# Patient Record
Sex: Male | Born: 1950
Health system: Southern US, Community
[De-identification: ages and names within clinical notes are randomized; demographics above are authoritative.]

## PROBLEM LIST (undated history)

## (undated) DIAGNOSIS — I10 Essential (primary) hypertension: Secondary | ICD-10-CM

## (undated) DIAGNOSIS — N4340 Spermatocele of epididymis, unspecified: Secondary | ICD-10-CM

## (undated) HISTORY — DX: Essential (primary) hypertension: I10

## (undated) HISTORY — PX: TONSILLECTOMY: SUR1361

## (undated) HISTORY — PX: HERNIA REPAIR: SHX51

---

## 2005-12-05 ENCOUNTER — Ambulatory Visit: Payer: Self-pay | Admitting: Unknown Physician Specialty

## 2006-01-17 DIAGNOSIS — Z860100 Personal history of colon polyps, unspecified: Secondary | ICD-10-CM | POA: Insufficient documentation

## 2007-07-08 DIAGNOSIS — Z87891 Personal history of nicotine dependence: Secondary | ICD-10-CM | POA: Insufficient documentation

## 2009-12-05 DIAGNOSIS — Z8249 Family history of ischemic heart disease and other diseases of the circulatory system: Secondary | ICD-10-CM | POA: Insufficient documentation

## 2013-06-24 HISTORY — PX: COLONOSCOPY W/ POLYPECTOMY: SHX1380

## 2013-06-24 LAB — HM COLONOSCOPY

## 2014-01-26 ENCOUNTER — Emergency Department: Payer: Self-pay | Admitting: Emergency Medicine

## 2014-01-26 LAB — BASIC METABOLIC PANEL
Anion Gap: 8 (ref 7–16)
BUN: 21 mg/dL — ABNORMAL HIGH (ref 7–18)
CHLORIDE: 107 mmol/L (ref 98–107)
Calcium, Total: 8.5 mg/dL (ref 8.5–10.1)
Co2: 26 mmol/L (ref 21–32)
Creatinine: 0.9 mg/dL (ref 0.60–1.30)
Glucose: 98 mg/dL (ref 65–99)
OSMOLALITY: 284 (ref 275–301)
Potassium: 3.7 mmol/L (ref 3.5–5.1)
Sodium: 141 mmol/L (ref 136–145)

## 2014-01-26 LAB — CBC WITH DIFFERENTIAL/PLATELET
BASOS ABS: 0 10*3/uL (ref 0.0–0.1)
BASOS PCT: 0.2 %
EOS ABS: 0.2 10*3/uL (ref 0.0–0.7)
EOS PCT: 3 %
HCT: 40.3 % (ref 40.0–52.0)
HGB: 13.7 g/dL (ref 13.0–18.0)
LYMPHS PCT: 37.6 %
Lymphocyte #: 3.1 10*3/uL (ref 1.0–3.6)
MCH: 31.8 pg (ref 26.0–34.0)
MCHC: 33.9 g/dL (ref 32.0–36.0)
MCV: 94 fL (ref 80–100)
Monocyte #: 0.5 x10 3/mm (ref 0.2–1.0)
Monocyte %: 6.7 %
NEUTROS PCT: 52.5 %
Neutrophil #: 4.3 10*3/uL (ref 1.4–6.5)
PLATELETS: 221 10*3/uL (ref 150–440)
RBC: 4.3 10*6/uL — AB (ref 4.40–5.90)
RDW: 13.3 % (ref 11.5–14.5)
WBC: 8.2 10*3/uL (ref 3.8–10.6)

## 2014-01-26 LAB — TROPONIN I

## 2014-01-28 ENCOUNTER — Ambulatory Visit: Payer: Self-pay | Admitting: Family Medicine

## 2014-04-06 LAB — LIPID PANEL
CHOLESTEROL: 190 mg/dL (ref 0–200)
HDL: 48 mg/dL (ref 35–70)
LDL CALC: 125 mg/dL
TRIGLYCERIDES: 83 mg/dL (ref 40–160)

## 2014-04-06 LAB — BASIC METABOLIC PANEL
BUN: 19 mg/dL (ref 4–21)
Creatinine: 1 mg/dL (ref 0.6–1.3)
Glucose: 99 mg/dL
Potassium: 5.1 mmol/L (ref 3.4–5.3)
Sodium: 138 mmol/L (ref 137–147)

## 2014-04-06 LAB — PSA: PSA: 0.8

## 2015-04-09 IMAGING — CR DG CHEST 2V
1 series · 2 of 2 positions shown · non-contrast
Comparison: None.

CLINICAL DATA: Left-sided chest pain, congestion

EXAM:
CHEST  2 VIEW

[Series 1: w chest pa · 0.14mm/px · 2 of 2 slices shown]
[im 1/2]
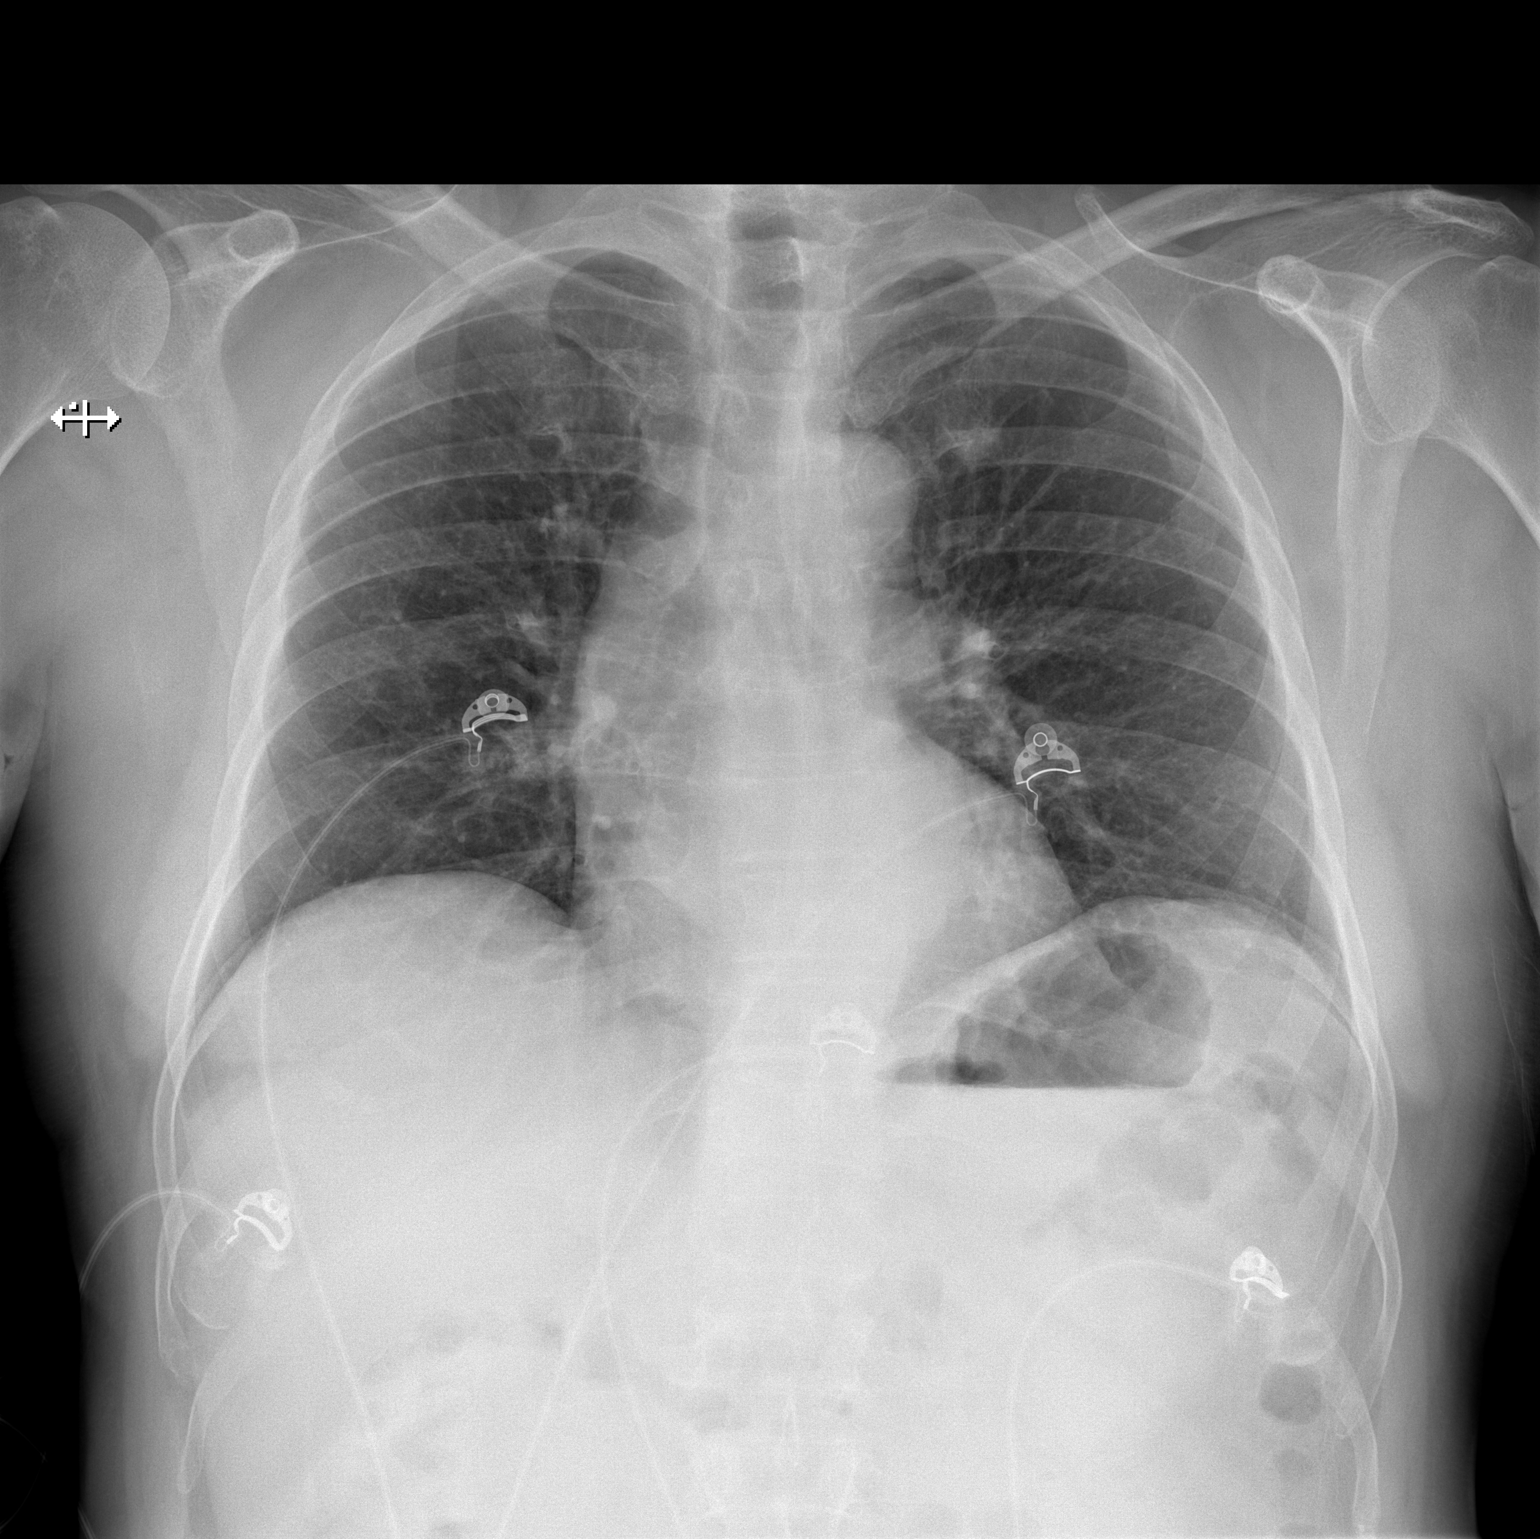
[im 2/2]
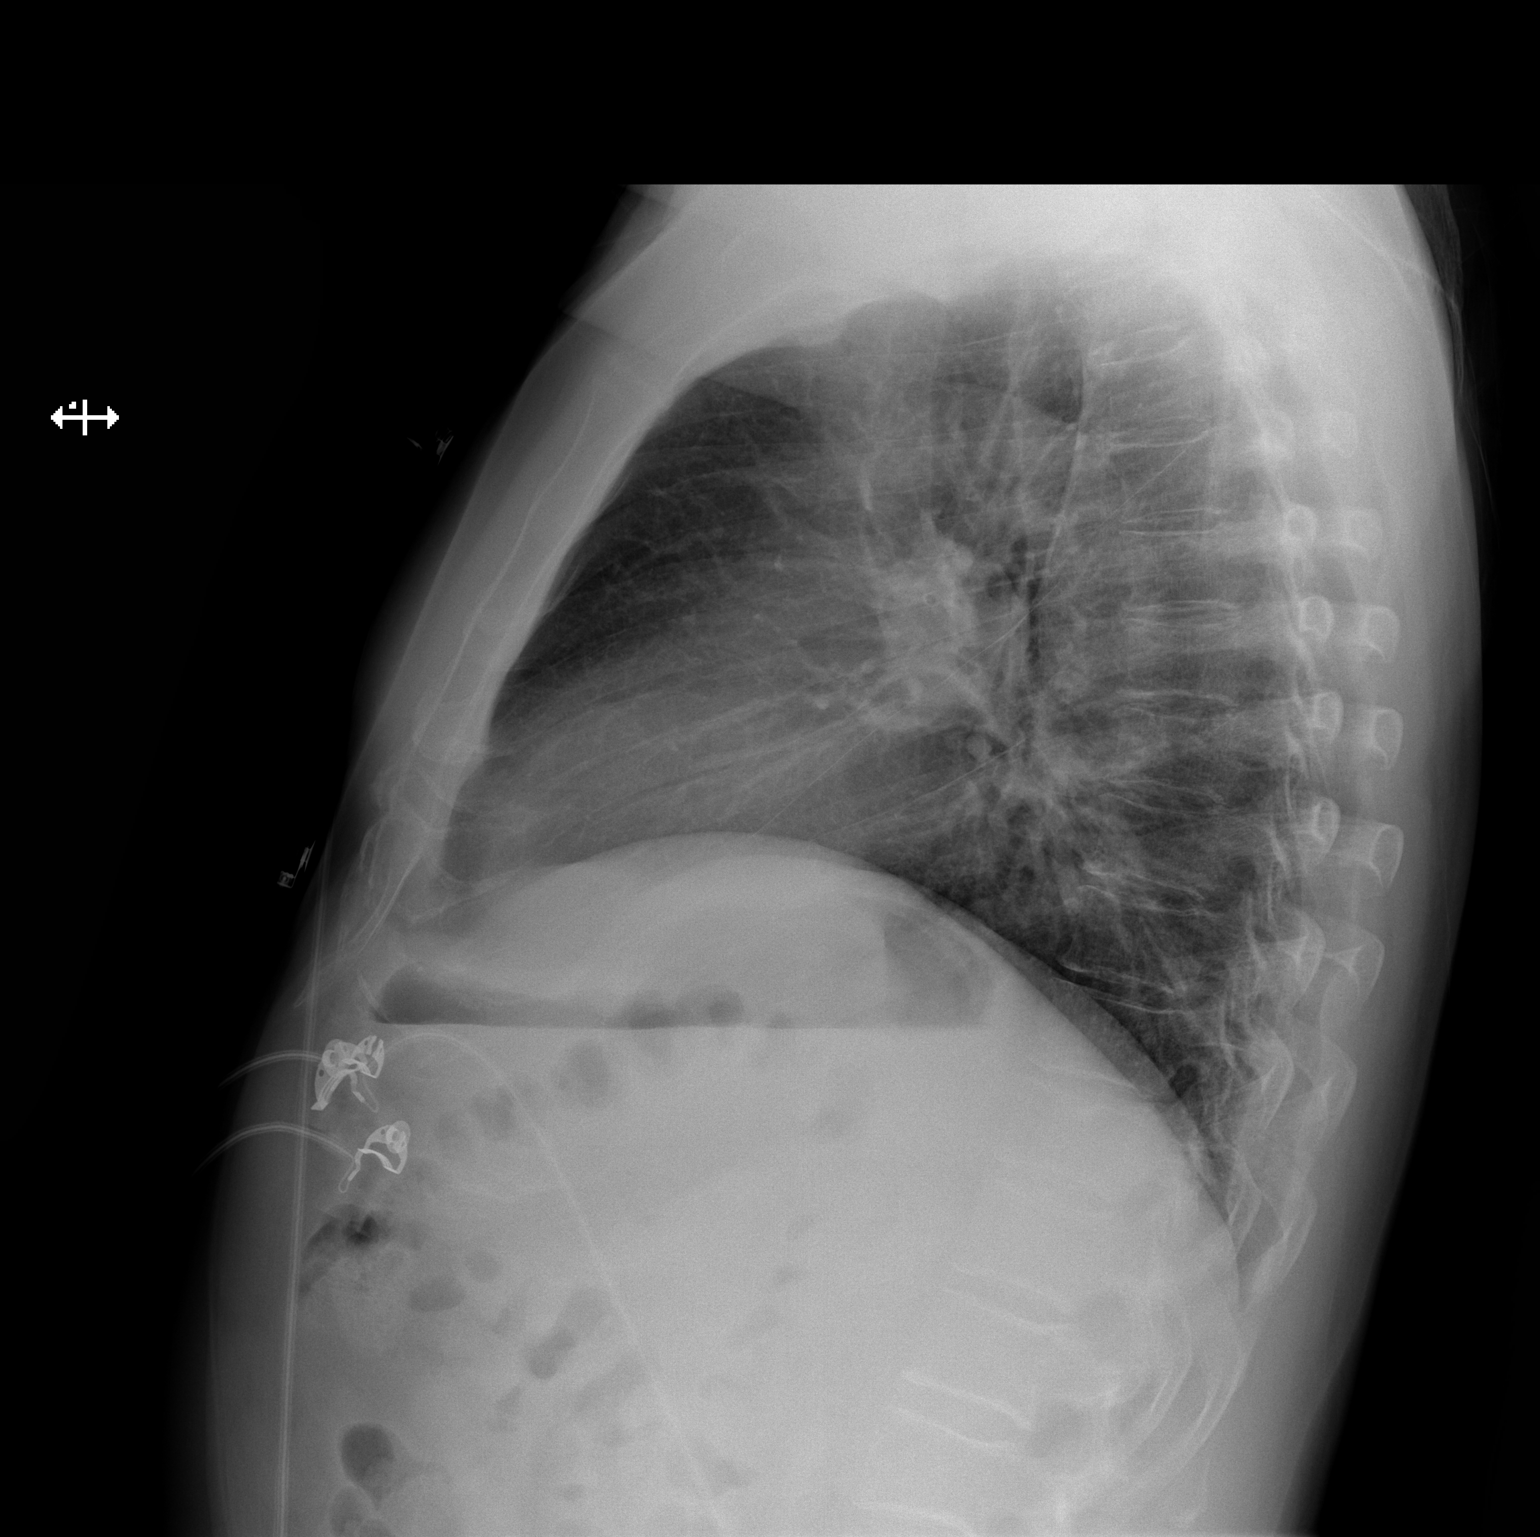

[2 of 2 positions shown; findings below may reference images not displayed]

FINDINGS: The cardiac and mediastinal silhouettes are within normal limits.

The lungs are normally inflated. No airspace consolidation, pleural
effusion, or pulmonary edema is identified. There is no
pneumothorax.

No acute osseous abnormality identified.
IMPRESSION: No active cardiopulmonary disease.

## 2015-04-13 DIAGNOSIS — N138 Other obstructive and reflux uropathy: Secondary | ICD-10-CM | POA: Insufficient documentation

## 2015-04-13 DIAGNOSIS — N401 Enlarged prostate with lower urinary tract symptoms: Secondary | ICD-10-CM | POA: Insufficient documentation

## 2015-04-13 DIAGNOSIS — N4340 Spermatocele of epididymis, unspecified: Secondary | ICD-10-CM | POA: Insufficient documentation

## 2015-04-14 ENCOUNTER — Encounter: Payer: Self-pay | Admitting: Family Medicine

## 2015-04-14 ENCOUNTER — Ambulatory Visit (INDEPENDENT_AMBULATORY_CARE_PROVIDER_SITE_OTHER): Payer: BLUE CROSS/BLUE SHIELD | Admitting: Family Medicine

## 2015-04-14 VITALS — BP 114/84 | HR 62 | Temp 97.7°F | Resp 16 | Ht 68.75 in | Wt 191.0 lb

## 2015-04-14 DIAGNOSIS — Z0000 Encounter for general adult medical examination without abnormal findings: Secondary | ICD-10-CM

## 2015-04-14 DIAGNOSIS — N400 Enlarged prostate without lower urinary tract symptoms: Secondary | ICD-10-CM | POA: Diagnosis not present

## 2015-04-14 DIAGNOSIS — Z23 Encounter for immunization: Secondary | ICD-10-CM | POA: Diagnosis not present

## 2015-04-14 MED ORDER — TAMSULOSIN HCL 0.4 MG PO CAPS: 0.4000 mg | ORAL_CAPSULE | Freq: Every day | ORAL | Status: DC

## 2015-04-14 NOTE — Patient Instructions (Signed)
A prescription for Flomax has been sent to [facility] on [address] in [place]

## 2015-04-14 NOTE — Progress Notes (Signed)
Patient: Anthony Oravec., Male    DOB: 29-Oct-1950, 64 y.o.   MRN: [id number] Visit Date: 04/14/2015  Today's Provider: Lelon Huh, MD   Chief Complaint  Patient presents with  . Annual Exam  . Benign Prostatic Hypertrophy   Subjective:    Annual physical exam  Anthony Levi. is a 64 y.o. male who presents today for health maintenance and complete physical. He feels fairly well. He reports exercising never. He reports he is sleeping poorly.  -----------------------------------------------------------------  BPH Follow up: Last office visit was 1 year ago. Management during this visit includes advising patient to start OTC Saw Palmetto to help with emptying bladder overnight. Today patient comes in stating he never started taking the [facility] because he couldn't remember the name of it. Patient feels like the symptoms have worsened since his last office visit. Has to get up several times every night to void. No burning or pain with urination.    Review of Systems  Constitutional: Negative for fever, chills, appetite change and fatigue.  HENT: Negative for congestion, ear pain, hearing loss, nosebleeds and trouble swallowing.   Eyes: Negative for pain and visual disturbance.  Respiratory: Negative for cough, chest tightness and shortness of breath.   Cardiovascular: Negative for chest pain, palpitations and leg swelling.  Gastrointestinal: Positive for abdominal pain (sharp pain occurs intermittently left upper quadrant). Negative for nausea, vomiting, diarrhea, constipation and blood in stool.  Endocrine: Negative for polydipsia, polyphagia and polyuria.  Genitourinary: Positive for frequency. Negative for dysuria and flank pain.  Musculoskeletal: Negative for myalgias, back pain, joint swelling, arthralgias and neck stiffness.  Skin: Negative for color change, rash and wound.  Neurological: Negative for dizziness, tremors, seizures, speech difficulty,  weakness, light-headedness and headaches.  Psychiatric/Behavioral: Negative for behavioral problems, confusion, sleep disturbance, dysphoric mood and decreased concentration. The patient is not nervous/anxious.   All other systems reviewed and are negative.   Social History He   Social History   Social History  . Marital Status: Married    Spouse Name: N/A  . Number of Children: N/A  . Years of Education: N/A   Social History Main Topics  . Smoking status: Not on file  . Smokeless tobacco: Not on file  . Alcohol Use: Not on file  . Drug Use: Not on file  . Sexual Activity: Not on file   Other Topics Concern  . Not on file   Social History Narrative  . No narrative on file    Patient Active Problem List   Diagnosis Date Noted  . Benign fibroma of prostate 04/13/2015  . Personal history of other infectious and parasitic diseases 04/13/2015  . Primary malignant neoplasm ([place]) 04/13/2015  . Family history of cardiovascular disease 12/05/2009  . History of tobacco use 07/08/2007  . History of colon polyps 01/17/2006    Past Surgical History  Procedure Laterality Date  . Colonoscopy w/ polypectomy  06/24/13  . Hernia repair    . Tonsillectomy      Family History  Family Status  Relation Status Death Age  . Mother Deceased   . Father Deceased 38  . Brother Alive     heart trouble and possible cancer  . Sister Alive   . Brother Alive    His family history includes Aneurysm in his brother; COPD in his mother; Heart attack in his father.    Allergies no known allergies  Previous Medications   No medications on  file    Patient Care Team: Birdie Sons, MD as PCP - General (Family Medicine)     Objective:   Vitals: BP 114/84 mmHg  Pulse 62  Temp(Src) 97.7 F (36.5 C) (Oral)  Resp 16  Ht 5' 8.75" (1.746 m)  Wt 191 lb (86.637 kg)  BMI 28.42 kg/m2  SpO2 95%   Physical Exam   General Appearance:    Alert, cooperative, no distress, appears stated age    Head:    Normocephalic, without obvious abnormality, atraumatic  Eyes:    PERRL, conjunctiva/corneas clear, EOM's intact, fundi    benign, both eyes       Ears:    Normal TM's and external ear canals, both ears  Nose:   Nares normal, septum midline, mucosa normal, no drainage   or sinus tenderness  Throat:   Lips, mucosa, and tongue normal; teeth and gums normal  Neck:   Supple, symmetrical, trachea midline, no adenopathy;       thyroid:  No enlargement/tenderness/nodules; no carotid   bruit or JVD  Back:     Symmetric, no curvature, ROM normal, no CVA tenderness  Lungs:     Clear to auscultation bilaterally, respirations unlabored  Chest wall:    No tenderness or deformity  Heart:    Regular rate and rhythm, S1 and S2 normal, no murmur, rub   or gallop  Abdomen:     Soft, non-tender, bowel sounds active all four quadrants,    no masses, no organomegaly  Genitalia:    deferred  Rectal:    no stool in vault and the prostate is enlarged at the bilateral, with an approx volume of 30 gms, 2+ bulge  Extremities:   Extremities normal, atraumatic, no cyanosis or edema  Pulses:   2+ and symmetric all extremities  Skin:   Skin color, texture, turgor normal, no rashes or lesions  Lymph nodes:   Cervical, supraclavicular, and axillary nodes normal  Neurologic:   CNII-XII intact. Normal strength, sensation and reflexes      throughout    Depression Screen PHQ 2/9 Scores 04/14/2015  PHQ - 2 Score 0  PHQ- 9 Score 1      Assessment & Plan:     Routine Health Maintenance and Physical Exam  Exercise Activities and Dietary recommendations Goals    None      Immunization History  Administered Date(s) Administered  . Hepatitis A 04/09/2014  . Tdap 07/08/2007  . Zoster 04/04/2012    Health Maintenance  Topic Date Due  . HIV Screening  08/08/1966  . INFLUENZA VACCINE  01/17/2016  . TETANUS/TDAP  07/07/2017  . COLONOSCOPY  06/25/2023  . ZOSTAVAX  Completed  . Hepatitis C  Screening  Completed      Discussed health benefits of physical activity, and encouraged him to engage in regular exercise appropriate for his age and condition.    --------------------------------------------------------------------  1. Annual physical exam  - Lipid panel - PSA - Basic metabolic panel  2. BPH (benign prostatic hyperplasia) - Try Flomax 0.4  3. Need for influenza vaccination  - Flu Vaccine QUAD 36+ mos IM

## 2015-04-15 ENCOUNTER — Telehealth: Payer: Self-pay

## 2015-04-15 LAB — BASIC METABOLIC PANEL
BUN/Creatinine Ratio: 18 (ref 10–22)
BUN: 17 mg/dL (ref 8–27)
CO2: 25 mmol/L (ref 18–29)
CREATININE: 0.96 mg/dL (ref 0.76–1.27)
Calcium: 9.5 mg/dL (ref 8.6–10.2)
Chloride: 101 mmol/L (ref 97–106)
GFR calc Af Amer: 97 mL/min/{1.73_m2} (ref >59)
GFR calc non Af Amer: 84 mL/min/{1.73_m2} (ref >59)
GLUCOSE: 96 mg/dL (ref 65–99)
Potassium: 5.6 mmol/L — ABNORMAL HIGH (ref 3.5–5.2)
Sodium: 141 mmol/L (ref 136–144)

## 2015-04-15 LAB — LIPID PANEL
CHOLESTEROL TOTAL: 203 mg/dL — AB (ref 100–199)
Chol/HDL Ratio: 4.1 ratio units (ref 0.0–5.0)
HDL: 50 mg/dL (ref >39)
LDL Calculated: 126 mg/dL — ABNORMAL HIGH (ref 0–99)
Triglycerides: 137 mg/dL (ref 0–149)
VLDL Cholesterol Cal: 27 mg/dL (ref 5–40)

## 2015-04-15 LAB — PSA: PROSTATE SPECIFIC AG, SERUM: 0.9 ng/mL (ref 0.0–4.0)

## 2015-04-15 NOTE — Telephone Encounter (Signed)
Patient advised of results and verbally voiced understanding.  

## 2015-04-15 NOTE — Telephone Encounter (Signed)
Pt is return call.  CB#[phone]/MW

## 2015-04-15 NOTE — Telephone Encounter (Signed)
-----   Message from Birdie Sons, MD sent at 04/15/2015  8:16 AM EDT ----- Cholesterol is a little high at 203. Should be below 200. Should reduce saturate fats such as red meat in diet. PSA. Sugar and kidney functions are normal. Check yearly.

## 2015-04-15 NOTE — Telephone Encounter (Signed)
Called patient back. No answer. Left message to call back.

## 2015-04-15 NOTE — Telephone Encounter (Signed)
Pt is returning call.  CB#[phone]/MW

## 2015-04-15 NOTE — Telephone Encounter (Signed)
Pt returned your call .  He said you could leave a detailed message .  Thanks Con Memos

## 2015-04-15 NOTE — Telephone Encounter (Signed)
[id number] 

## 2015-08-09 ENCOUNTER — Other Ambulatory Visit: Payer: Self-pay | Admitting: *Deleted

## 2015-08-09 DIAGNOSIS — N400 Enlarged prostate without lower urinary tract symptoms: Secondary | ICD-10-CM

## 2015-08-09 MED ORDER — TAMSULOSIN HCL 0.4 MG PO CAPS: 0.4000 mg | ORAL_CAPSULE | Freq: Every day | ORAL | Status: DC

## 2015-08-09 NOTE — Telephone Encounter (Signed)
Requesting 90 day supply.

## 2016-02-08 ENCOUNTER — Encounter: Payer: Self-pay | Admitting: Family Medicine

## 2016-02-08 ENCOUNTER — Ambulatory Visit (INDEPENDENT_AMBULATORY_CARE_PROVIDER_SITE_OTHER): Payer: BLUE CROSS/BLUE SHIELD | Admitting: Family Medicine

## 2016-02-08 VITALS — BP 130/98 | HR 70 | Temp 97.4°F | Resp 16 | Wt 195.0 lb

## 2016-02-08 DIAGNOSIS — H6693 Otitis media, unspecified, bilateral: Secondary | ICD-10-CM | POA: Diagnosis not present

## 2016-02-08 MED ORDER — AMOXICILLIN 500 MG PO CAPS: 1000.0000 mg | ORAL_CAPSULE | Freq: Two times a day (BID) | ORAL | 0 refills | Status: AC

## 2016-02-08 NOTE — Progress Notes (Signed)
       Patient: Anthony Reynolds. Male    DOB: 03-11-1951   65 y.o.   MRN: [id number] Visit Date: 02/08/2016  Today's Provider: Lelon Huh, MD   Chief Complaint  Patient presents with  . Ear Pain   Subjective:    Both ears feel congested and there is occasional pain. Patient had sinus congestion and sore throat 2 weeks ago, that has subsided now. Some congestion still remains.    Otalgia   There is pain in both ears. This is a new problem. The current episode started 1 to 4 weeks ago (2 weeks). The problem occurs constantly. The problem has been unchanged. There has been no fever. The pain is at a severity of 2/10. The pain is mild. Associated symptoms include ear discharge, headaches and hearing loss. Pertinent negatives include no abdominal pain, coughing, diarrhea, neck pain, rash, rhinorrhea, sore throat or vomiting. He has tried nothing for the symptoms.   Right worse than left.    No Known Allergies Current Meds  Medication Sig  . [DISCONTINUED] tamsulosin (FLOMAX) 0.4 MG CAPS capsule Take 1 capsule (0.4 mg total) by mouth daily.    Review of Systems  Constitutional: Negative for appetite change, chills and fever.  HENT: Positive for congestion, ear discharge, ear pain and hearing loss. Negative for rhinorrhea and sore throat.   Respiratory: Negative for cough, chest tightness, shortness of breath and wheezing.   Cardiovascular: Negative for chest pain and palpitations.  Gastrointestinal: Negative for abdominal pain, diarrhea, nausea and vomiting.  Musculoskeletal: Negative for neck pain.  Skin: Negative for rash.  Neurological: Positive for headaches.    Social History  Substance Use Topics  . Smoking status: Former Research scientist (life sciences)  . Smokeless tobacco: Not on file  . Alcohol use No   Objective:   BP (!) 130/98 (BP Location: Right Arm, Patient Position: Sitting, Cuff Size: Large)   Pulse 70   Temp 97.4 F (36.3 C) (Oral)   Resp 16   Wt 195 lb (88.5 kg)   SpO2  95%   BMI 29.01 kg/m   Physical Exam  General Appearance:    Alert, cooperative, no distress  HENT:   bilateral TM red, dull, bulging  Eyes:    PERRL, conjunctiva/corneas clear, EOM's intact       Lungs:     Clear to auscultation bilaterally, respirations unlabored  Heart:    Regular rate and rhythm  Neurologic:   Awake, alert, oriented x 3. No apparent focal neurological           defect.           Assessment & Plan:     1. Bilateral acute otitis media, recurrence not specified, unspecified otitis media type  - amoxicillin (AMOXIL) 500 MG capsule; Take 2 capsules (1,000 mg total) by mouth 2 (two) times daily.  Dispense: 40 capsule; Refill: 0  Call if symptoms change or if not rapidly improving.          Lelon Huh, MD  [facility]

## 2016-02-16 ENCOUNTER — Telehealth: Payer: Self-pay | Admitting: Family Medicine

## 2016-02-16 MED ORDER — AZITHROMYCIN 250 MG PO TABS: ORAL_TABLET | ORAL | 0 refills | Status: AC

## 2016-02-16 NOTE — Telephone Encounter (Signed)
Pt's wife called saying her husband seen Dr. Caryn Section last week for a sinus infection.  He was given a Rx for antibiotic for 10 days but his ears are stopped up.    He still has a couple days left of the antibiotic but not feeling a lot better  Please advise  Thanks Con Memos

## 2016-02-16 NOTE — Telephone Encounter (Signed)
Can try Zpack, have sent rx to pharmacy.

## 2016-02-17 NOTE — Telephone Encounter (Signed)
Advised patient as below.  

## 2016-02-23 ENCOUNTER — Telehealth: Payer: Self-pay | Admitting: Family Medicine

## 2016-02-23 NOTE — Telephone Encounter (Signed)
Patient advised. Patient scheduled a follow up appointment for 02/24/2016 at 1:45pm

## 2016-02-23 NOTE — Telephone Encounter (Signed)
Pt called saying he has taken 10 days of amoxil and then just finished his zpak.  He says his ears are still bothering him.  His right ear is especially stopped up.  His call back is [phone]  Thanks, Con Memos

## 2016-02-23 NOTE — Telephone Encounter (Signed)
Please advise 

## 2016-02-23 NOTE — Telephone Encounter (Signed)
They should be better by now. He should come in to recheck ears, make sure there is no wax, obstruction, or infection of the ear canals.

## 2016-02-24 ENCOUNTER — Encounter: Payer: Self-pay | Admitting: Family Medicine

## 2016-02-24 ENCOUNTER — Ambulatory Visit (INDEPENDENT_AMBULATORY_CARE_PROVIDER_SITE_OTHER): Payer: BLUE CROSS/BLUE SHIELD | Admitting: Family Medicine

## 2016-02-24 VITALS — BP 114/70 | HR 77 | Temp 98.0°F | Resp 16 | Wt 191.0 lb

## 2016-02-24 DIAGNOSIS — H9201 Otalgia, right ear: Secondary | ICD-10-CM

## 2016-02-24 MED ORDER — NEOMYCIN-POLYMYXIN-HC 3.5-10000-1 OT SOLN
4.0000 [drp] | Freq: Four times a day (QID) | OTIC | 0 refills | Status: AC
Start: 2016-02-24 — End: 2016-03-05

## 2016-02-24 NOTE — Progress Notes (Signed)
       Patient: Anthony Reynolds. Male    DOB: February 11, 1951   65 y.o.   MRN: [id number] Visit Date: 02/24/2016  Today's Provider: Lelon Huh, MD   Chief Complaint  Patient presents with  . Follow-up    ear pain   Subjective:    HPI  Follow up for ear pain  The patient was last seen for this 3 weeks ago. Changes made at last visit include starting Amoxicillin which he finished with improvement of left ear pain, but minimal improvement on right. Was followed by azithromycin also with no improvement. Also been feeling a little more fatigued and short of breath on exertion.   He reports excellent compliance with treatment. He feels that condition is Unchanged. Patient reports ears feel "stopped up" and is still having some sinus drainage, cough, and nasal congestion. Patient reports right ear is worse than left and worsened since last visit.   He is not having side effects.   ------------------------------------------------------------------------------------    No Known Allergies  No current outpatient prescriptions on file.  Review of Systems  Constitutional: Negative.   HENT: Positive for congestion and ear pain.   Respiratory: Positive for choking.     Social History  Substance Use Topics  . Smoking status: Former Research scientist (life sciences)  . Smokeless tobacco: Never Used  . Alcohol use No   Objective:   BP 114/70 (BP Location: Left Arm, Patient Position: Sitting, Cuff Size: Large)   Pulse 77   Temp 98 F (36.7 C) (Oral)   Resp 16   Wt 191 lb (86.6 kg)   SpO2 98%   BMI 28.41 kg/m   Physical Exam  General Appearance:    Alert, cooperative, no distress  HENT:   left TM normal without fluid or infection, Right distal ear canal inflamed with scabbed appearance on lower posterior TM neck without nodes, throat normal without erythema or exudate and sinuses nontender  Eyes:    PERRL, conjunctiva/corneas clear, EOM's intact       Lungs:     Clear to auscultation bilaterally,  respirations unlabored  Heart:    Regular rate and rhythm  Neurologic:   Awake, alert, oriented x 3. No apparent focal neurological           defect.           Assessment & Plan:     1. Right ear pain Irritated around TM, but appears to have scabbed lesion on TM or maybe a small piece of cerumen attached to it. Try cortisporin over the weekend, if not much better will have ENT evaluate next week.  - neomycin-polymyxin-hydrocortisone (CORTISPORIN) otic solution; Place 4 drops into the right ear 4 (four) times daily.  Dispense: 10 mL; Refill: 0       Lelon Huh, MD  [facility]

## 2016-04-11 ENCOUNTER — Telehealth: Payer: Self-pay | Admitting: Family Medicine

## 2016-04-11 DIAGNOSIS — Z0000 Encounter for general adult medical examination without abnormal findings: Secondary | ICD-10-CM

## 2016-04-11 NOTE — Telephone Encounter (Signed)
Please review. KW 

## 2016-04-11 NOTE — Telephone Encounter (Signed)
Pt is having an upcoming CPE and wants to go to the lab the morning of his PE and get his labs done.  Con Memos

## 2016-04-16 ENCOUNTER — Encounter: Payer: Self-pay | Admitting: Family Medicine

## 2016-04-16 ENCOUNTER — Ambulatory Visit (INDEPENDENT_AMBULATORY_CARE_PROVIDER_SITE_OTHER): Payer: BLUE CROSS/BLUE SHIELD | Admitting: Family Medicine

## 2016-04-16 VITALS — BP 130/88 | HR 51 | Temp 97.9°F | Resp 16 | Ht 69.0 in | Wt 193.0 lb

## 2016-04-16 DIAGNOSIS — Z0000 Encounter for general adult medical examination without abnormal findings: Secondary | ICD-10-CM | POA: Diagnosis not present

## 2016-04-16 DIAGNOSIS — Z23 Encounter for immunization: Secondary | ICD-10-CM

## 2016-04-16 NOTE — Patient Instructions (Addendum)
Recommend using OTC fluticasone (Flonase spray) for nasal and eustachian tube congestion

## 2016-04-16 NOTE — Progress Notes (Signed)
Patient: Anthony Flood., Male    DOB: [birth year], 65 y.o.   MRN: [id number] Visit Date: 04/16/2016  Today's Provider: Lelon Huh, MD   Chief Complaint  Patient presents with  . Annual Exam   Subjective:    Annual physical exam Anthony Soltero. is a 65 y.o. male who presents today for health maintenance and complete physical. He feels well. He reports exercising no. He reports he is sleeping well.  ----------------------------------------------------------------  Cholesterol Check: From 04/14/2015-checked labs which showed cholesterol a mildly elevated. Recommended reducing saturated fats such as red meats in diet.  Review of Systems  HENT: Positive for congestion and ear discharge.   Eyes: Negative.   Respiratory: Negative.   Cardiovascular: Negative.   Gastrointestinal: Negative.   Endocrine: Negative.   Genitourinary: Negative.   Musculoskeletal: Negative.   Allergic/Immunologic: Negative.   Neurological: Negative.   Hematological: Negative.   Psychiatric/Behavioral: Negative.   All other systems reviewed and are negative.   Social History      He  reports that he has quit smoking. He has never used smokeless tobacco. He reports that he does not drink alcohol or use drugs.       Social History   Social History  . Marital status: Married    Spouse name: N/A  . Number of children: 2  . Years of education: N/A   Occupational History  . Self Employed     Farming   Social History Main Topics  . Smoking status: Former Research scientist (life sciences)  . Smokeless tobacco: Never Used  . Alcohol use No  . Drug use: No  . Sexual activity: Not Asked   Other Topics Concern  . None   Social History Narrative  . None    History reviewed. No pertinent past medical history.   Patient Active Problem List   Diagnosis Date Noted  . BPH (benign prostatic hyperplasia) 04/13/2015  . Spermatocele 04/13/2015  . Family history of cardiovascular disease 12/05/2009  .  History of tobacco use 07/08/2007  . History of colon polyps 01/17/2006    Past Surgical History:  Procedure Laterality Date  . COLONOSCOPY W/ POLYPECTOMY  06/24/13  . HERNIA REPAIR    . TONSILLECTOMY      Family History        Family Status  Relation Status  . Mother Deceased  . Father Deceased at age 69  . Brother Alive   heart trouble and possible cancer  . Sister Alive  . Brother Alive        His family history includes Aneurysm in his brother; COPD in his mother; Heart attack in his father.    No Known Allergies  No outpatient prescriptions have been marked as taking for the 04/16/16 encounter (Office Visit) with Birdie Sons, MD.    Patient Care Team: Birdie Sons, MD as PCP - General (Family Medicine)     Objective:   Vitals: BP 130/88 (BP Location: Left Arm, Patient Position: Sitting, Cuff Size: Large)   Pulse (!) 51   Temp 97.9 F (36.6 C) (Oral)   Resp 16   Ht 5\' 9"  (1.753 m)   Wt 193 lb (87.5 kg)   SpO2 96%   BMI 28.50 kg/m    Physical Exam    General Appearance:    Alert, cooperative, no distress, appears stated age  Head:    Normocephalic, without obvious abnormality, atraumatic  Eyes:    PERRL, conjunctiva/corneas clear, EOM's intact,  fundi    benign, both eyes       Ears:    Normal TM's and external ear canals, both ears  Nose:   Nares normal, septum midline, mucosa normal, no drainage   or sinus tenderness  Throat:   Lips, mucosa, and tongue normal; teeth and gums normal  Neck:   Supple, symmetrical, trachea midline, no adenopathy;       thyroid:  No enlargement/tenderness/nodules; no carotid   bruit or JVD  Back:     Symmetric, no curvature, ROM normal, no CVA tenderness  Lungs:     Clear to auscultation bilaterally, respirations unlabored  Chest wall:    No tenderness or deformity  Heart:    Regular rate and rhythm, S1 and S2 normal, no murmur, rub   or gallop  Abdomen:     Soft, non-tender, bowel sounds active all four  quadrants,    no masses, no organomegaly  Genitalia:    deferred  Rectal:    deferred  Extremities:   Extremities normal, atraumatic, no cyanosis or edema  Pulses:   2+ and symmetric all extremities  Skin:   Skin color, texture, turgor normal, no rashes or lesions  Lymph nodes:   Cervical, supraclavicular, and axillary nodes normal  Neurologic:   CNII-XII intact. Normal strength, sensation and reflexes      throughout    Fall Risk  04/16/2016  Falls in the past year? No      Depression Screen PHQ 2/9 Scores 04/16/2016 04/14/2015  PHQ - 2 Score 0 0  PHQ- 9 Score 0 1      Assessment & Plan:     Routine Health Maintenance and Physical Exam  Exercise Activities and Dietary recommendations Goals    None      Immunization History  Administered Date(s) Administered  . Hepatitis A 04/09/2014  . Hepatitis A, Adult 04/14/2015  . Influenza,inj,Quad PF,36+ Mos 04/14/2015  . Tdap 07/08/2007  . Zoster 04/04/2012    Health Maintenance  Topic Date Due  . HIV Screening  08/08/1966  . INFLUENZA VACCINE  01/17/2016  . TETANUS/TDAP  07/07/2017  . COLONOSCOPY  06/25/2023  . ZOSTAVAX  Completed  . Hepatitis C Screening  Completed      Discussed health benefits of physical activity, and encouraged him to engage in regular exercise appropriate for his age and condition.    -------------------------------------------------------------------- 1. Annual physical exam   2. Need for influenza vaccination  - Flu Vaccine QUAD 36+ mos IM  He has been having some intermittent pressure in his ears, alternating between left and right. Recommend OTC fluticasone nasal spray. He can call back for ENT referral if this is not helpful.   Lelon Huh, MD  [facility]

## 2016-04-17 LAB — COMPREHENSIVE METABOLIC PANEL
ALBUMIN: 4.7 g/dL (ref 3.6–4.8)
ALT: 6 IU/L (ref 0–44)
AST: 15 IU/L (ref 0–40)
Albumin/Globulin Ratio: 2.2 (ref 1.2–2.2)
Alkaline Phosphatase: 59 IU/L (ref 39–117)
BUN / CREAT RATIO: 15 (ref 10–24)
BUN: 15 mg/dL (ref 8–27)
Bilirubin Total: 0.5 mg/dL (ref 0.0–1.2)
CALCIUM: 9.2 mg/dL (ref 8.6–10.2)
CO2: 26 mmol/L (ref 18–29)
CREATININE: 1 mg/dL (ref 0.76–1.27)
Chloride: 102 mmol/L (ref 96–106)
GFR, EST AFRICAN AMERICAN: 92 mL/min/{1.73_m2} (ref >59)
GFR, EST NON AFRICAN AMERICAN: 79 mL/min/{1.73_m2} (ref >59)
GLOBULIN, TOTAL: 2.1 g/dL (ref 1.5–4.5)
Glucose: 89 mg/dL (ref 65–99)
Potassium: 4.5 mmol/L (ref 3.5–5.2)
SODIUM: 141 mmol/L (ref 134–144)
TOTAL PROTEIN: 6.8 g/dL (ref 6.0–8.5)

## 2016-04-17 LAB — LIPID PANEL
CHOL/HDL RATIO: 4.2 ratio (ref 0.0–5.0)
Cholesterol, Total: 196 mg/dL (ref 100–199)
HDL: 47 mg/dL (ref >39)
LDL CALC: 127 mg/dL — AB (ref 0–99)
TRIGLYCERIDES: 109 mg/dL (ref 0–149)
VLDL Cholesterol Cal: 22 mg/dL (ref 5–40)

## 2016-04-17 LAB — PSA: Prostate Specific Ag, Serum: 1 ng/mL (ref 0.0–4.0)

## 2017-04-12 ENCOUNTER — Telehealth: Payer: Self-pay | Admitting: Family Medicine

## 2017-04-12 DIAGNOSIS — Z136 Encounter for screening for cardiovascular disorders: Secondary | ICD-10-CM

## 2017-04-12 DIAGNOSIS — Z131 Encounter for screening for diabetes mellitus: Secondary | ICD-10-CM

## 2017-04-12 DIAGNOSIS — Z125 Encounter for screening for malignant neoplasm of prostate: Secondary | ICD-10-CM

## 2017-04-12 NOTE — Telephone Encounter (Signed)
Please advise 

## 2017-04-12 NOTE — Telephone Encounter (Signed)
Pt wants to have his labs done before his appt on 04/17/17.  Please call when lab slip is ready

## 2017-04-12 NOTE — Telephone Encounter (Signed)
Patient is now on Medicare.

## 2017-04-12 NOTE — Telephone Encounter (Signed)
Labs we can order is determined by insurance. Is he still on BCBS or is he on Medicare now.

## 2017-04-17 ENCOUNTER — Ambulatory Visit (INDEPENDENT_AMBULATORY_CARE_PROVIDER_SITE_OTHER): Payer: Medicare Other | Admitting: Family Medicine

## 2017-04-17 ENCOUNTER — Encounter: Payer: Self-pay | Admitting: Family Medicine

## 2017-04-17 VITALS — BP 120/80 | HR 69 | Temp 97.8°F | Resp 16 | Ht 69.0 in | Wt 188.0 lb

## 2017-04-17 DIAGNOSIS — Z23 Encounter for immunization: Secondary | ICD-10-CM

## 2017-04-17 DIAGNOSIS — Z125 Encounter for screening for malignant neoplasm of prostate: Secondary | ICD-10-CM | POA: Diagnosis not present

## 2017-04-17 DIAGNOSIS — Z0000 Encounter for general adult medical examination without abnormal findings: Secondary | ICD-10-CM

## 2017-04-17 DIAGNOSIS — Z6827 Body mass index (BMI) 27.0-27.9, adult: Secondary | ICD-10-CM

## 2017-04-17 DIAGNOSIS — Z131 Encounter for screening for diabetes mellitus: Secondary | ICD-10-CM | POA: Diagnosis not present

## 2017-04-17 DIAGNOSIS — Z136 Encounter for screening for cardiovascular disorders: Secondary | ICD-10-CM | POA: Diagnosis not present

## 2017-04-17 LAB — LIPID PANEL
CHOL/HDL RATIO: 3.4 (calc) (ref <5.0)
CHOLESTEROL: 181 mg/dL (ref <200)
HDL: 54 mg/dL (ref >40)
LDL CHOLESTEROL (CALC): 113 mg/dL — AB
NON-HDL CHOLESTEROL (CALC): 127 mg/dL (ref <130)
TRIGLYCERIDES: 57 mg/dL (ref <150)

## 2017-04-17 LAB — PSA: PSA: 0.7 ng/mL (ref <=4.0)

## 2017-04-17 LAB — GLUCOSE, RANDOM: Glucose, Bld: 94 mg/dL (ref 65–99)

## 2017-04-17 NOTE — Patient Instructions (Addendum)
   Start taking vitamin B-12 1,000 mg a day   Contact [facility] to schedule follow up colonoscopy

## 2017-04-17 NOTE — Progress Notes (Signed)
Patient: Anthony Reynolds, Male    DOB: [birth year], 66 y.o.   MRN: [id number] Visit Date: 04/17/2017  Today's Provider: Lelon Huh, MD   Chief Complaint  Patient presents with  . Medicare Wellness   Subjective:   Initial preventative physical exam Anthony Reynolds is a 66 y.o. male who presents today for his Initial Preventative Physical Exam. He feels fairly well. He reports exercising work. He reports he is sleeping well.    Review of Systems  Constitutional: Negative.   HENT: Positive for ear pain and sinus pain.   Eyes: Negative.   Respiratory: Negative.   Cardiovascular: Negative.   Gastrointestinal: Negative.   Endocrine: Negative.   Genitourinary: Positive for scrotal swelling. Testicular pain: enlarged   Is scheduled to see Dr. Bernardo Heater on 05/02/2017  Social History   Social History  . Marital status: Married    Spouse name: N/A  . Number of children: 2  . Years of education: N/A   Occupational History  . Self Employed     Farming   Social History Main Topics  . Smoking status: Former Smoker    Quit date: 06/18/1973  . Smokeless tobacco: Never Used  . Alcohol use No  . Drug use: No  . Sexual activity: Not on file   Other Topics Concern  . Not on file   Social History Narrative  . No narrative on file    History reviewed. No pertinent past medical history.   Patient Active Problem List   Diagnosis Date Noted  . BPH (benign prostatic hyperplasia) 04/13/2015  . Spermatocele 04/13/2015  . Family history of cardiovascular disease 12/05/2009  . History of tobacco use 07/08/2007  . History of colon polyps 01/17/2006    Past Surgical History:  Procedure Laterality Date  . COLONOSCOPY W/ POLYPECTOMY  06/24/13  . HERNIA REPAIR    . TONSILLECTOMY      His family history includes Aneurysm in his brother; COPD in his mother; Heart attack in his father.     No current outpatient prescriptions on file.   Patient Care Team: Birdie Sons,  MD as PCP - General (Family Medicine)      Objective:   Vitals: BP 120/80 (BP Location: Right Arm, Patient Position: Sitting, Cuff Size: Large)   Pulse 69   Temp 97.8 F (36.6 C) (Oral)   Resp 16   Ht 5\' 9"  (1.753 m)   Wt 188 lb (85.3 kg)   SpO2 95%   BMI 27.76 kg/m   Physical Exam   General Appearance:    Alert, cooperative, no distress, appears stated age, overweight  Head:    Normocephalic, without obvious abnormality, atraumatic  Eyes:    PERRL, conjunctiva/corneas clear, EOM's intact, fundi    benign, both eyes       Ears:    Normal TM's and external ear canals, both ears  Nose:   Nares normal, septum midline, mucosa normal, no drainage   or sinus tenderness  Throat:   Lips, mucosa, and tongue normal; teeth and gums normal  Neck:   Supple, symmetrical, trachea midline, no adenopathy;       thyroid:  No enlargement/tenderness/nodules; no carotid   bruit or JVD  Back:     Symmetric, no curvature, ROM normal, no CVA tenderness  Lungs:     Clear to auscultation bilaterally, respirations unlabored  Chest wall:    No tenderness or deformity  Heart:    Regular rate and rhythm,  S1 and S2 normal, no murmur, rub   or gallop  Abdomen:     Soft, non-tender, bowel sounds active all four quadrants,    no masses, no organomegaly  Genitalia:    deferred  Rectal:    deferred  Extremities:   Extremities normal, atraumatic, no cyanosis or edema  Pulses:   2+ and symmetric all extremities  Skin:   Skin color, texture, turgor normal, no rashes or lesions  Lymph nodes:   Cervical, supraclavicular, and axillary nodes normal  Neurologic:   CNII-XII intact. Normal strength, sensation and reflexes      throughout    No exam data present  Activities of Daily Living In your present state of health, do you have any difficulty performing the following activities: 04/17/2017  Hearing? N  Vision? N  Difficulty concentrating or making decisions? N  Walking or climbing stairs? N  Dressing  or bathing? N  Doing errands, shopping? N  Some recent data might be hidden    Fall Risk Assessment Fall Risk  04/17/2017 04/16/2016  Falls in the past year? No No     Depression Screen PHQ 2/9 Scores 04/17/2017 04/16/2016 04/14/2015  PHQ - 2 Score 0 0 0  PHQ- 9 Score 0 0 1    Cognitive Testing - 6-CIT  Correct? Score   What year is it? yes 0 0 or 4  What month is it? yes 0 0 or 3  Memorize:    Pia Mau,  [birth year],  [place],      What time is it? (within 1 hour) yes 0 0 or 3  Count backwards from 20 yes 0 0, 2, or 4  Name the months of the year yes 0 0, 2, or 4  Repeat name & address above yes 3 0, 2, 4, 6, 8, or 10       TOTAL SCORE  3/28   Interpretation:  Normal  Normal (0-7) Abnormal (8-28)    Audit-C Alcohol Use Screening  Question Answer Points  How often do you have alcoholic drink? never 0  On days you do drink alcohol, how many drinks do you typically consume? 0 0  How oftey will you drink 6 or more in a total? never 0  Total Score:  0   A score of 3 or more in women, and 4 or more in men indicates increased risk for alcohol abuse, EXCEPT if all of the points are from question 1.    Assessment & Plan:     Initial Preventative Physical Exam  Reviewed patient's Family Medical History Reviewed and updated list of patient's medical providers Assessment of cognitive impairment was done Assessed patient's functional ability Established a written schedule for health screening [place] Completed and Reviewed  Exercise Activities and Dietary recommendations Goals    None      Immunization History  Administered Date(s) Administered  . Hepatitis A 04/09/2014  . Hepatitis A, Adult 04/14/2015  . Influenza,inj,Quad PF,6+ Mos 04/14/2015, 04/16/2016  . Tdap 07/08/2007  . Zoster 04/04/2012    Health Maintenance  Topic Date Due  . HIV Screening  08/08/1966  . COLONOSCOPY  06/24/2016  . PNA vac Low Risk Adult (1 of 2 -  PCV13) 08/08/2016  . INFLUENZA VACCINE  01/16/2017  . TETANUS/TDAP  07/07/2017  . Hepatitis C Screening  Completed     Discussed health benefits of physical activity, and encouraged him to engage in regular exercise appropriate for his age  and condition.    ------------------------------------------------------------------------------------------------------------  1. Welcome to Medicare preventive visit Advised he is due for follow up colonoscopy due to history of colon polyps and to contact [facility].  - EKG 12-Lead  2. Need for influenza vaccination  - Flu vaccine HIGH DOSE PF  3. Need for pneumococcal vaccination  - Pneumococcal conjugate vaccine 13-valent IM  4. BMI 27.0-27.9,adult Encouraged regular exercise and prudent diet. To get and keep weight below 175 pounds.    Lelon Huh, MD  [facility]

## 2017-05-02 ENCOUNTER — Other Ambulatory Visit: Payer: Self-pay | Admitting: Radiology

## 2017-05-02 ENCOUNTER — Ambulatory Visit (INDEPENDENT_AMBULATORY_CARE_PROVIDER_SITE_OTHER): Payer: Medicare Other | Admitting: Urology

## 2017-05-02 ENCOUNTER — Encounter: Payer: Self-pay | Admitting: Urology

## 2017-05-02 ENCOUNTER — Encounter: Payer: Self-pay | Admitting: Radiology

## 2017-05-02 VITALS — BP 137/84 | HR 59 | Ht 69.0 in | Wt 184.0 lb

## 2017-05-02 DIAGNOSIS — N50819 Testicular pain, unspecified: Secondary | ICD-10-CM | POA: Diagnosis not present

## 2017-05-02 DIAGNOSIS — N433 Hydrocele, unspecified: Secondary | ICD-10-CM | POA: Diagnosis not present

## 2017-05-02 LAB — URINALYSIS, COMPLETE
BILIRUBIN UA: NEGATIVE
GLUCOSE, UA: NEGATIVE
Ketones, UA: NEGATIVE
Leukocytes, UA: NEGATIVE
NITRITE UA: NEGATIVE
Protein, UA: NEGATIVE
RBC, UA: NEGATIVE
Specific Gravity, UA: 1.015 (ref 1.005–1.030)
UUROB: 0.2 mg/dL (ref 0.2–1.0)
pH, UA: 6.5 (ref 5.0–7.5)

## 2017-05-02 NOTE — Progress Notes (Signed)
05/02/2017 9:06 AM   [name] Jun 05, 1951 [id number]  Referring provider: Birdie Sons, MD [address] [place], [place] [address]  Chief Complaint  Patient presents with  . Groin Swelling    New Patient    HPI: Anthony Reynolds is a 66 year old male who presents for evaluation of right hemiscrotal swelling.  I saw him at [facility] in October 2014 for an asymptomatic large right spermatocele.  He desired observation.  He states over the last 2-3 months that the swelling has increased.  He has no significant discomfort however is bothered by the increase in size.  He has recently noted decreased semen volume and thought this may be due to the spermatocele.  He has mild erectile dysfunction.  PMH: History reviewed. No pertinent past medical history.  Surgical History: Past Surgical History:  Procedure Laterality Date  . COLONOSCOPY W/ POLYPECTOMY  06/24/13  . HERNIA REPAIR    . TONSILLECTOMY      Home Medications:  Allergies as of 05/02/2017   No Known Allergies     Medication List    as of 05/02/2017  9:06 AM   You have not been prescribed any medications.     Allergies: No Known Allergies  Family History: Family History  Problem Relation Age of Onset  . COPD Mother   . Heart attack Father   . Aneurysm Brother   . Prostate cancer Neg Hx   . Kidney cancer Neg Hx   . Bladder Cancer Neg Hx     Social History:  reports that he quit smoking about 43 years ago. he has never used smokeless tobacco. He reports that he does not drink alcohol or use drugs.  ROS: UROLOGY Frequent Urination?: No Hard to postpone urination?: No Burning/pain with urination?: No Get up at night to urinate?: Yes Leakage of urine?: No Urine stream starts and stops?: No Trouble starting stream?: No Do you have to strain to urinate?: No Blood in urine?: No Urinary tract infection?: No Sexually transmitted disease?: No Injury to kidneys or bladder?: No Painful  intercourse?: No Weak stream?: No Erection problems?: Yes Penile pain?: No  Gastrointestinal Nausea?: No Vomiting?: No Indigestion/heartburn?: Yes Diarrhea?: No Constipation?: No  Constitutional Fever: No Night sweats?: No Weight loss?: No Fatigue?: No  Skin Skin rash/lesions?: No Itching?: No  Eyes Blurred vision?: No Double vision?: No  Ears/Nose/Throat Sore throat?: No Sinus problems?: No  Hematologic/Lymphatic Swollen glands?: No Easy bruising?: No  Cardiovascular Leg swelling?: No Chest pain?: No  Respiratory Cough?: No Shortness of breath?: No  Endocrine Excessive thirst?: No  Musculoskeletal Back pain?: No Joint pain?: No  Neurological Headaches?: No Dizziness?: No  Psychologic Depression?: No Anxiety?: No  Physical Exam: BP 137/84   Pulse (!) 59   Ht 5\' 9"  (1.753 m)   Wt 184 lb (83.5 kg)   BMI 27.17 kg/m   Constitutional:  Alert and oriented, No acute distress. HEENT: [place] AT, moist mucus membranes.  Trachea midline, no masses. Cardiovascular: No clubbing, cyanosis, or edema. RRR Respiratory: Normal respiratory effort, no increased work of breathing. Lungs clear GI: Abdomen is soft, nontender, nondistended, no abdominal masses GU: No CVA tenderness.  Enlarged right hemiscrotal mass consistent with a hydrocele.  The right testis is not palpable.  No evidence of hernia. Skin: No rashes, bruises or suspicious lesions. Lymph: No cervical or inguinal adenopathy. Neurologic: Grossly intact, no focal deficits, moving all 4 extremities. Psychiatric: Normal mood and affect.  Laboratory Data: Lab Results  Component Value Date   WBC 8.2 01/26/2014   HGB 13.7 01/26/2014   HCT 40.3 01/26/2014   MCV 94 01/26/2014   PLT 221 01/26/2014    Lab Results  Component Value Date   CREATININE 1.00 04/16/2016    Lab Results  Component Value Date   PSA1 1.0 04/16/2016   PSA1 0.9 04/14/2015    Urinalysis Dipstick/microscopy  negative   Assessment & Plan:    1. Hydrocele, unspecified hydrocele type Prior exam in 2014 remarkable for a palpable right testis however the testis is not palpable on today's exam and findings are consistent with a right hydrocele.  He was informed that treatment would not impact his semen volume and unlikely to improve his erectile dysfunction.  We discussed management options including continued observation, aspiration/sclerotherapy and hydrocelectomy.  He would like to proceed with hydrocelectomy.  The procedure was discussed in detail including potential risks of bleeding, infection, recurrence, testicular atrophy as well as the risks of anesthesia.  He indicated all questions were answered to his satisfaction and desires to proceed.  A scrotal ultrasound was ordered.  - Urinalysis, Complete - US Scrotum; Future   Abbie Sons, [place] [address] [place], [place] [address] [phone]

## 2017-05-02 NOTE — H&P (View-Only) (Signed)
05/02/2017 9:06 AM   Anthony Reynolds 30-Jan-1951 [id number]  Referring provider: Birdie Sons, MD [address] [place], [place] [address]  Chief Complaint  Patient presents with  . Groin Swelling    New Patient    HPI: Anthony Reynolds is a 66 year old male who presents for evaluation of right hemiscrotal swelling.  I saw him at [facility] in October 2014 for an asymptomatic large right spermatocele.  He desired observation.  He states over the last 2-3 months that the swelling has increased.  He has no significant discomfort however is bothered by the increase in size.  He has recently noted decreased semen volume and thought this may be due to the spermatocele.  He has mild erectile dysfunction.  PMH: History reviewed. No pertinent past medical history.  Surgical History: Past Surgical History:  Procedure Laterality Date  . COLONOSCOPY W/ POLYPECTOMY  06/24/13  . HERNIA REPAIR    . TONSILLECTOMY      Home Medications:  Allergies as of 05/02/2017   No Known Allergies     Medication List    as of 05/02/2017  9:06 AM   You have not been prescribed any medications.     Allergies: No Known Allergies  Family History: Family History  Problem Relation Age of Onset  . COPD Mother   . Heart attack Father   . Aneurysm Brother   . Prostate cancer Neg Hx   . Kidney cancer Neg Hx   . Bladder Cancer Neg Hx     Social History:  reports that he quit smoking about 43 years ago. he has never used smokeless tobacco. He reports that he does not drink alcohol or use drugs.  ROS: UROLOGY Frequent Urination?: No Hard to postpone urination?: No Burning/pain with urination?: No Get up at night to urinate?: Yes Leakage of urine?: No Urine stream starts and stops?: No Trouble starting stream?: No Do you have to strain to urinate?: No Blood in urine?: No Urinary tract infection?: No Sexually transmitted disease?: No Injury to kidneys or bladder?: No Painful  intercourse?: No Weak stream?: No Erection problems?: Yes Penile pain?: No  Gastrointestinal Nausea?: No Vomiting?: No Indigestion/heartburn?: Yes Diarrhea?: No Constipation?: No  Constitutional Fever: No Night sweats?: No Weight loss?: No Fatigue?: No  Skin Skin rash/lesions?: No Itching?: No  Eyes Blurred vision?: No Double vision?: No  Ears/Nose/Throat Sore throat?: No Sinus problems?: No  Hematologic/Lymphatic Swollen glands?: No Easy bruising?: No  Cardiovascular Leg swelling?: No Chest pain?: No  Respiratory Cough?: No Shortness of breath?: No  Endocrine Excessive thirst?: No  Musculoskeletal Back pain?: No Joint pain?: No  Neurological Headaches?: No Dizziness?: No  Psychologic Depression?: No Anxiety?: No  Physical Exam: BP 137/84   Pulse (!) 59   Ht 5\' 9"  (1.753 m)   Wt 184 lb (83.5 kg)   BMI 27.17 kg/m   Constitutional:  Alert and oriented, No acute distress. HEENT: [place] AT, moist mucus membranes.  Trachea midline, no masses. Cardiovascular: No clubbing, cyanosis, or edema. RRR Respiratory: Normal respiratory effort, no increased work of breathing. Lungs clear GI: Abdomen is soft, nontender, nondistended, no abdominal masses GU: No CVA tenderness.  Enlarged right hemiscrotal mass consistent with a hydrocele.  The right testis is not palpable.  No evidence of hernia. Skin: No rashes, bruises or suspicious lesions. Lymph: No cervical or inguinal adenopathy. Neurologic: Grossly intact, no focal deficits, moving all 4 extremities. Psychiatric: Normal mood and affect.  Laboratory Data: Lab Results  Component Value Date   WBC 8.2 01/26/2014   HGB 13.7 01/26/2014   HCT 40.3 01/26/2014   MCV 94 01/26/2014   PLT 221 01/26/2014    Lab Results  Component Value Date   CREATININE 1.00 04/16/2016    Lab Results  Component Value Date   PSA1 1.0 04/16/2016   PSA1 0.9 04/14/2015    Urinalysis Dipstick/microscopy  negative   Assessment & Plan:    1. Hydrocele, unspecified hydrocele type Prior exam in 2014 remarkable for a palpable right testis however the testis is not palpable on today's exam and findings are consistent with a right hydrocele.  He was informed that treatment would not impact his semen volume and unlikely to improve his erectile dysfunction.  We discussed management options including continued observation, aspiration/sclerotherapy and hydrocelectomy.  He would like to proceed with hydrocelectomy.  The procedure was discussed in detail including potential risks of bleeding, infection, recurrence, testicular atrophy as well as the risks of anesthesia.  He indicated all questions were answered to his satisfaction and desires to proceed.  A scrotal ultrasound was ordered.  - Urinalysis, Complete - US Scrotum; Future   Abbie Sons, [place] [address] [place], [place] [address] [phone]

## 2017-05-14 ENCOUNTER — Ambulatory Visit
Admission: RE | Admit: 2017-05-14 | Discharge: 2017-05-14 | Disposition: A | Discharge status: Home / Self Care | Payer: Medicare Other | Source: Ambulatory Visit | Attending: Urology | Admitting: Urology

## 2017-05-14 DIAGNOSIS — N503 Cyst of epididymis: Secondary | ICD-10-CM | POA: Diagnosis not present

## 2017-05-14 DIAGNOSIS — N433 Hydrocele, unspecified: Secondary | ICD-10-CM | POA: Diagnosis not present

## 2017-05-16 ENCOUNTER — Other Ambulatory Visit: Payer: Self-pay | Admitting: Radiology

## 2017-05-17 ENCOUNTER — Telehealth: Payer: Self-pay

## 2017-05-17 ENCOUNTER — Other Ambulatory Visit: Payer: Self-pay

## 2017-05-17 ENCOUNTER — Encounter
Admission: RE | Admit: 2017-05-17 | Discharge: 2017-05-17 | Disposition: A | Discharge status: Home / Self Care | Payer: Medicare Other | Source: Ambulatory Visit | Attending: Urology | Admitting: Urology

## 2017-05-17 HISTORY — DX: Spermatocele of epididymis, unspecified: N43.40

## 2017-05-17 NOTE — Telephone Encounter (Signed)
-----   Message from Abbie Sons, MD sent at 05/16/2017 10:09 AM EST ----- Scrotal ultrasound shows a large spermatocele.  Proceed with surgery as scheduled

## 2017-05-17 NOTE — Telephone Encounter (Signed)
Will send a letter

## 2017-05-17 NOTE — Patient Instructions (Signed)
Your procedure is scheduled on:  Friday, May 24, 2017 Report to Same Day Surgery on the 2nd floor in the [facility]. To find out your arrival time, please call [phone] between 1PM - 3PM on: Thursday, May 23, 2017  REMEMBER: Instructions that are not followed completely may result in serious medical risk, up to and including death; or upon the discretion of your surgeon and anesthesiologist your surgery may need to be rescheduled.  Do not eat food after midnight the night before your procedure.  No gum chewing or hard candies.  You may however, drink CLEAR liquids up to 2 hours before you are scheduled to arrive at the hospital for your procedure.  Do not drink clear liquids within 2 hours of the start of your surgery.  Clear liquids include: - water  - apple juice without pulp - clear gatorade - black coffee or tea (Do NOT add anything to the coffee or tea) Do NOT drink anything that is not on this list.  No Alcohol for 24 hours before or after surgery.  No Smoking including e-cigarettes for 24 hours prior to surgery. No chewable tobacco products for at least 6 hours prior to surgery. No nicotine patches on the day of surgery.  Notify your doctor if there is any change in your medical condition (cold, fever, infection).  Do not wear jewelry, make-up, hairpins, clips or nail polish.  Do not wear lotions, powders, or perfumes. You may wear deodorant.  Do not shave 48 hours prior to surgery. Men may shave face and neck.  Contacts and dentures may not be worn into surgery.  Do not bring valuables to the hospital. [facility] is not responsible for any belongings or valuables.   TAKE THESE MEDICATIONS THE MORNING OF SURGERY WITH A SIP OF WATER:  NONE  NOW!!  Stop ASPIRIN AND Anti-inflammatories such as Advil, Aleve, Ibuprofen, Motrin, Naproxen, Naprosyn, Goodie powder, or aspirin products. (May take Tylenol or Acetaminophen if needed.)  Stop ANY OVER THE COUNTER  supplements until after surgery.   If you are being discharged the day of surgery, you will not be allowed to drive home. You will need someone to drive you home and stay with you that night.   If you are taking public transportation, you will need to have a responsible adult to with you.  Please call the number above if you have any questions about these instructions.

## 2017-05-23 MED ORDER — CEFAZOLIN SODIUM-DEXTROSE 2-4 GM/100ML-% IV SOLN
2.0000 g | INTRAVENOUS | Status: AC
  Administered 2017-05-24: 2 g via INTRAVENOUS

## 2017-05-24 ENCOUNTER — Ambulatory Visit: Payer: Medicare Other | Admitting: Registered Nurse

## 2017-05-24 ENCOUNTER — Encounter
Admission: RE | Disposition: A | Discharge status: Home / Self Care | Payer: Self-pay | Source: Ambulatory Visit | Attending: Urology

## 2017-05-24 ENCOUNTER — Ambulatory Visit
Admission: RE | Admit: 2017-05-24 | Discharge: 2017-05-24 | Disposition: A | Discharge status: Home / Self Care | Payer: Medicare Other | Source: Ambulatory Visit | Attending: Urology | Admitting: Urology

## 2017-05-24 DIAGNOSIS — N433 Hydrocele, unspecified: Secondary | ICD-10-CM

## 2017-05-24 DIAGNOSIS — N432 Other hydrocele: Secondary | ICD-10-CM | POA: Diagnosis not present

## 2017-05-24 DIAGNOSIS — Z87891 Personal history of nicotine dependence: Secondary | ICD-10-CM | POA: Insufficient documentation

## 2017-05-24 DIAGNOSIS — N4341 Spermatocele of epididymis, single: Secondary | ICD-10-CM | POA: Insufficient documentation

## 2017-05-24 DIAGNOSIS — N4340 Spermatocele of epididymis, unspecified: Secondary | ICD-10-CM

## 2017-05-24 DIAGNOSIS — N529 Male erectile dysfunction, unspecified: Secondary | ICD-10-CM | POA: Insufficient documentation

## 2017-05-24 HISTORY — PX: SPERMATOCELECTOMY: SHX2420

## 2017-05-24 SURGERY — EXCISION, SPERMATOCELE
Anesthesia: General | Site: Scrotum | Laterality: Right | Wound class: Clean

## 2017-05-24 MED ORDER — ONDANSETRON HCL 4 MG/2ML IJ SOLN
INTRAMUSCULAR | Status: AC
  Filled 2017-05-24: qty 2

## 2017-05-24 MED ORDER — LACTATED RINGERS IV SOLN
INTRAVENOUS | Status: DC
  Administered 2017-05-24: 09:00:00 via INTRAVENOUS

## 2017-05-24 MED ORDER — LIDOCAINE HCL (PF) 2 % IJ SOLN
INTRAMUSCULAR | Status: AC
  Filled 2017-05-24: qty 10

## 2017-05-24 MED ORDER — SULFAMETHOXAZOLE-TRIMETHOPRIM 800-160 MG PO TABS: 1.0000 | ORAL_TABLET | Freq: Two times a day (BID) | ORAL | 0 refills | Status: DC

## 2017-05-24 MED ORDER — BUPIVACAINE HCL 0.5 % IJ SOLN
INTRAMUSCULAR | Status: DC | PRN
  Administered 2017-05-24: 6 mL

## 2017-05-24 MED ORDER — ONDANSETRON HCL 4 MG/2ML IJ SOLN
INTRAMUSCULAR | Status: DC | PRN
  Administered 2017-05-24: 4 mg via INTRAVENOUS

## 2017-05-24 MED ORDER — DEXAMETHASONE SODIUM PHOSPHATE 10 MG/ML IJ SOLN
INTRAMUSCULAR | Status: AC
  Filled 2017-05-24: qty 1

## 2017-05-24 MED ORDER — MIDAZOLAM HCL 2 MG/2ML IJ SOLN
INTRAMUSCULAR | Status: DC | PRN
  Administered 2017-05-24: 2 mg via INTRAVENOUS

## 2017-05-24 MED ORDER — BACITRACIN ZINC 500 UNIT/GM EX OINT
TOPICAL_OINTMENT | CUTANEOUS | Status: AC
  Filled 2017-05-24: qty 28.35

## 2017-05-24 MED ORDER — PROPOFOL 10 MG/ML IV BOLUS
INTRAVENOUS | Status: DC | PRN
  Administered 2017-05-24: 20 mg via INTRAVENOUS
  Administered 2017-05-24: 180 mg via INTRAVENOUS

## 2017-05-24 MED ORDER — FENTANYL CITRATE (PF) 100 MCG/2ML IJ SOLN
INTRAMUSCULAR | Status: DC | PRN
  Administered 2017-05-24 (×3): 25 ug via INTRAVENOUS

## 2017-05-24 MED ORDER — OXYCODONE-ACETAMINOPHEN 5-325 MG PO TABS: 1.0000 | ORAL_TABLET | Freq: Four times a day (QID) | ORAL | 0 refills | Status: DC | PRN

## 2017-05-24 MED ORDER — OXYCODONE HCL 5 MG/5ML PO SOLN
5.0000 mg | Freq: Once | ORAL | Status: AC | PRN
Start: 2017-05-24 — End: 2017-05-24

## 2017-05-24 MED ORDER — FENTANYL CITRATE (PF) 100 MCG/2ML IJ SOLN
INTRAMUSCULAR | Status: AC
  Filled 2017-05-24: qty 2

## 2017-05-24 MED ORDER — DEXAMETHASONE SODIUM PHOSPHATE 10 MG/ML IJ SOLN
INTRAMUSCULAR | Status: DC | PRN
  Administered 2017-05-24: 10 mg via INTRAVENOUS

## 2017-05-24 MED ORDER — KETOROLAC TROMETHAMINE 30 MG/ML IJ SOLN
INTRAMUSCULAR | Status: AC
  Filled 2017-05-24: qty 1

## 2017-05-24 MED ORDER — LIDOCAINE HCL (CARDIAC) 20 MG/ML IV SOLN
INTRAVENOUS | Status: DC | PRN
  Administered 2017-05-24: 100 mg via INTRAVENOUS

## 2017-05-24 MED ORDER — MIDAZOLAM HCL 2 MG/2ML IJ SOLN
INTRAMUSCULAR | Status: AC
  Filled 2017-05-24: qty 2

## 2017-05-24 MED ORDER — GLYCOPYRROLATE 0.2 MG/ML IJ SOLN
INTRAMUSCULAR | Status: DC | PRN
  Administered 2017-05-24: 0.2 mg via INTRAVENOUS

## 2017-05-24 MED ORDER — MEPERIDINE HCL 50 MG/ML IJ SOLN: 6.2500 mg | INTRAMUSCULAR | Status: DC | PRN

## 2017-05-24 MED ORDER — OXYCODONE HCL 5 MG PO TABS
ORAL_TABLET | ORAL | Status: AC
  Filled 2017-05-24: qty 1

## 2017-05-24 MED ORDER — CEFAZOLIN SODIUM-DEXTROSE 2-4 GM/100ML-% IV SOLN
INTRAVENOUS | Status: AC
  Filled 2017-05-24: qty 100

## 2017-05-24 MED ORDER — BUPIVACAINE HCL (PF) 0.5 % IJ SOLN
INTRAMUSCULAR | Status: AC
  Filled 2017-05-24: qty 30

## 2017-05-24 MED ORDER — FAMOTIDINE 20 MG PO TABS
20.0000 mg | ORAL_TABLET | Freq: Once | ORAL | Status: AC
  Administered 2017-05-24: 20 mg via ORAL

## 2017-05-24 MED ORDER — EPHEDRINE SULFATE 50 MG/ML IJ SOLN
INTRAMUSCULAR | Status: DC | PRN
  Administered 2017-05-24: 5 mg via INTRAVENOUS

## 2017-05-24 MED ORDER — OXYCODONE HCL 5 MG PO TABS
5.0000 mg | ORAL_TABLET | Freq: Once | ORAL | Status: AC | PRN
Start: 2017-05-24 — End: 2017-05-24
  Administered 2017-05-24: 5 mg via ORAL

## 2017-05-24 MED ORDER — KETOROLAC TROMETHAMINE 30 MG/ML IJ SOLN
INTRAMUSCULAR | Status: AC
  Administered 2017-05-24: 30 mg via INTRAVENOUS
  Filled 2017-05-24: qty 1

## 2017-05-24 MED ORDER — KETOROLAC TROMETHAMINE 30 MG/ML IJ SOLN
30.0000 mg | Freq: Once | INTRAMUSCULAR | Status: AC
  Administered 2017-05-24: 30 mg via INTRAVENOUS

## 2017-05-24 MED ORDER — FAMOTIDINE 20 MG PO TABS
ORAL_TABLET | ORAL | Status: AC
  Administered 2017-05-24: 20 mg via ORAL
  Filled 2017-05-24: qty 1

## 2017-05-24 MED ORDER — PROMETHAZINE HCL 25 MG/ML IJ SOLN: 6.2500 mg | INTRAMUSCULAR | Status: DC | PRN

## 2017-05-24 MED ORDER — FENTANYL CITRATE (PF) 100 MCG/2ML IJ SOLN: 25.0000 ug | INTRAMUSCULAR | Status: DC | PRN

## 2017-05-24 SURGICAL SUPPLY — 33 items
BLADE SURG 15 STRL LF DISP TIS (BLADE) ×1 IMPLANT
BLADE SURG 15 STRL SS (BLADE) ×2
CANISTER SUCT 1200ML W/VALVE (MISCELLANEOUS) ×3 IMPLANT
CHLORAPREP W/TINT 26ML (MISCELLANEOUS) ×3 IMPLANT
DERMABOND ADVANCED (GAUZE/BANDAGES/DRESSINGS) ×2
DERMABOND ADVANCED .7 DNX12 (GAUZE/BANDAGES/DRESSINGS) ×1 IMPLANT
DRAIN PENROSE 1/4X12 LTX (DRAIN) ×3 IMPLANT
DRAIN PENROSE 5/8X18 LTX STRL (WOUND CARE) IMPLANT
DRAPE LAPAROTOMY 77X122 PED (DRAPES) ×3 IMPLANT
ELECT CAUTERY NEEDLE TIP 1.0 (MISCELLANEOUS)
ELECT REM PT RETURN 9FT ADLT (ELECTROSURGICAL) ×3
ELECTRODE CAUTERY NEDL TIP 1.0 (MISCELLANEOUS) IMPLANT
ELECTRODE REM PT RTRN 9FT ADLT (ELECTROSURGICAL) ×1 IMPLANT
GAUZE SPONGE 4X4 12PLY STRL (GAUZE/BANDAGES/DRESSINGS) ×3 IMPLANT
GLOVE BIO SURGEON STRL SZ 6.5 (GLOVE) ×2 IMPLANT
GLOVE BIO SURGEONS STRL SZ 6.5 (GLOVE) ×1
GOWN STRL REUS W/ TWL LRG LVL3 (GOWN DISPOSABLE) ×2 IMPLANT
GOWN STRL REUS W/TWL LRG LVL3 (GOWN DISPOSABLE) ×4
KIT RM TURNOVER STRD PROC AR (KITS) ×3 IMPLANT
LABEL OR SOLS (LABEL) ×3 IMPLANT
NEEDLE HYPO 25X1 1.5 SAFETY (NEEDLE) ×3 IMPLANT
NS IRRIG 1000ML POUR BTL (IV SOLUTION) ×3 IMPLANT
PACK BASIN MINOR ARMC (MISCELLANEOUS) ×3 IMPLANT
SUPPORETR ATHLETIC LG (MISCELLANEOUS) ×1 IMPLANT
SUPPORTER ATHLETIC LG (MISCELLANEOUS) ×3
SUT CHROMIC 3 0 SH 27 (SUTURE) IMPLANT
SUT ETHILON 3-0 FS-10 30 BLK (SUTURE) ×3
SUT MNCRL 3 0 RB1 (SUTURE) ×1 IMPLANT
SUT MONOCRYL 3 0 RB1 (SUTURE) ×2
SUT VIC AB 3-0 SH 27 (SUTURE) ×4
SUT VIC AB 3-0 SH 27X BRD (SUTURE) ×2 IMPLANT
SUTURE EHLN 3-0 FS-10 30 BLK (SUTURE) ×1 IMPLANT
SYR 10ML LL (SYRINGE) ×3 IMPLANT

## 2017-05-24 NOTE — Transfer of Care (Signed)
Immediate Anesthesia Transfer of Care Note  Patient: [name]  Procedure(s) Performed: SPERMATOCELECTOMY (Right Scrotum)  Patient Location: PACU  Anesthesia Type:General  Level of Consciousness: awake, alert  and oriented  Airway & Oxygen Therapy: Patient Spontanous Breathing and Patient connected to face mask oxygen  Post-op Assessment: Report given to RN and Post -op Vital signs reviewed and stable  Post vital signs: Reviewed and stable  Last Vitals:  Vitals:   05/24/17 0846 05/24/17 1137  BP: (!) 143/92 (!) 133/96  Pulse: 69 71  Resp: 16 16  Temp:  36.5 C  SpO2: 99% 100%    Last Pain: There were no vitals filed for this visit.       Complications: No apparent anesthesia complications

## 2017-05-24 NOTE — Anesthesia Preprocedure Evaluation (Signed)
Anesthesia Evaluation  Patient identified by MRN, date of birth, ID band Patient awake    Reviewed: Allergy & Precautions, NPO status , Patient's Chart, lab work & pertinent test results  History of Anesthesia Complications Negative for: history of anesthetic complications  Airway Mallampati: II  TM Distance: <3 FB Neck ROM: Full    Dental no notable dental hx.    Pulmonary neg sleep apnea, neg COPD, former smoker,    breath sounds clear to auscultation- rhonchi (-) wheezing      Cardiovascular Exercise Tolerance: Good (-) hypertension(-) CAD and (-) Past MI  Rhythm:Regular Rate:Normal - Systolic murmurs and - Diastolic murmurs    Neuro/Psych negative neurological ROS  negative psych ROS   GI/Hepatic negative GI ROS, Neg liver ROS,   Endo/Other  negative endocrine ROSneg diabetes  Renal/GU negative Renal ROS     Musculoskeletal negative musculoskeletal ROS (+)   Abdominal (+) - obese,   Peds  Hematology negative hematology ROS (+)   Anesthesia Other Findings Past Medical History: No date: Spermatocele   Reproductive/Obstetrics                             Anesthesia Physical Anesthesia Plan  ASA: I  Anesthesia Plan: General   Post-op Pain Management:    Induction: Intravenous  PONV Risk Score and Plan: 1 and Ondansetron and Dexamethasone  Airway Management Planned: LMA  Additional Equipment:   Intra-op Plan:   Post-operative Plan:   Informed Consent: I have reviewed the patients History and Physical, chart, labs and discussed the procedure including the risks, benefits and alternatives for the proposed anesthesia with the patient or authorized representative who has indicated his/her understanding and acceptance.   Dental advisory given  Plan Discussed with: CRNA and Anesthesiologist  Anesthesia Plan Comments:         Anesthesia Quick Evaluation

## 2017-05-24 NOTE — Anesthesia Procedure Notes (Signed)
Procedure Name: LMA Insertion Date/Time: 05/24/2017 10:09 AM Performed by: Hedda Slade, CRNA Pre-anesthesia Checklist: Patient identified, Patient being monitored, Timeout performed, Emergency Drugs available and Suction available Patient Re-evaluated:Patient Re-evaluated prior to induction Oxygen Delivery Method: Circle system utilized Preoxygenation: Pre-oxygenation with 100% oxygen Induction Type: IV induction Ventilation: Mask ventilation without difficulty LMA: LMA inserted LMA Size: 4.5 Tube type: Oral Number of attempts: 1 Placement Confirmation: positive ETCO2 and breath sounds checked- equal and bilateral Tube secured with: Tape Dental Injury: Teeth and Oropharynx as per pre-operative assessment

## 2017-05-24 NOTE — Anesthesia Postprocedure Evaluation (Signed)
Anesthesia Post Note  Patient: [name]  Procedure(s) Performed: SPERMATOCELECTOMY (Right Scrotum)  Patient location during evaluation: PACU Anesthesia Type: General Level of consciousness: awake and alert and oriented Pain management: pain level controlled Vital Signs Assessment: post-procedure vital signs reviewed and stable Respiratory status: spontaneous breathing, nonlabored ventilation and respiratory function stable Cardiovascular status: blood pressure returned to baseline and stable Postop Assessment: no signs of nausea or vomiting Anesthetic complications: no     Last Vitals:  Vitals:   05/24/17 1237 05/24/17 1303  BP: (!) 141/96 (!) 151/88  Pulse: 63   Resp: 16   Temp: (!) 36.4 C   SpO2: 100%     Last Pain:  Vitals:   05/24/17 1303  PainSc: 7                  Naava Janeway

## 2017-05-24 NOTE — Discharge Instructions (Signed)

## 2017-05-24 NOTE — Interval H&P Note (Signed)
History and Physical Interval Note:  05/24/2017 10:00 AM  Anthony Reynolds  has presented today for surgery, with the diagnosis of Right hydrocele  The various methods of treatment have been discussed with the patient and family. After consideration of risks, benefits and other options for treatment, the patient has consented to  Procedure(s): SPERMATOCELECTOMY (N/A) as a surgical intervention .  The patient's history has been reviewed, patient examined, no change in status, stable for surgery.  I have reviewed the patient's chart and labs.  Questions were answered to the patient's satisfaction.    Site marked CV:RRR Lungs:Clear  Abbie Sons

## 2017-05-24 NOTE — Anesthesia Post-op Follow-up Note (Signed)
Anesthesia QCDR form completed.        

## 2017-05-25 ENCOUNTER — Encounter: Payer: Self-pay | Admitting: Urology

## 2017-05-27 LAB — SURGICAL PATHOLOGY

## 2017-05-28 ENCOUNTER — Encounter: Payer: Self-pay | Admitting: Urology

## 2017-05-28 ENCOUNTER — Ambulatory Visit (INDEPENDENT_AMBULATORY_CARE_PROVIDER_SITE_OTHER): Payer: Medicare Other | Admitting: Urology

## 2017-05-28 VITALS — BP 154/92 | HR 74 | Ht 69.0 in | Wt 190.4 lb

## 2017-05-28 DIAGNOSIS — Z09 Encounter for follow-up examination after completed treatment for conditions other than malignant neoplasm: Secondary | ICD-10-CM

## 2017-05-28 NOTE — Op Note (Signed)
Date of procedure: 05/24/2017  Preoperative diagnosis:  1. Right spermatocele  Postoperative diagnosis:  1. Right spermatocele  Procedure: 1. Right spermatocelectomy  Surgeon: John Giovanni, MD  Anesthesia: General  Complications: None  Intraoperative findings:  multicystic right spermatocele with largest cysts measuring 8 cm and 6 cm respectively  EBL: Minimal  Specimens: Spermatoceles  Drains: 1/4 inch Penrose  Indication: Anthony Reynolds is a 66 y.o. patient with a gradually enlarging right hemiscrotal mass.  Scrotal sonogram remarkable for multicystic large spermatoceles. After reviewing the management options for treatment, he elected to proceed with the above surgical procedure(s). We have discussed the potential benefits and risks of the procedure, side effects of the proposed treatment, the likelihood of the patient achieving the goals of the procedure, and any potential problems that might occur during the procedure or recuperation. Informed consent has been obtained.  Description of procedure:  The patient was taken to the operating room and general anesthesia was induced.  The patient was placed in the dorsal lithotomy position, prepped and draped in the usual sterile fashion, and preoperative antibiotics were administered. A preoperative time-out was performed.   An approximately 6 cm midline scrotal incision was made.  The darkness was incised with a combination of sharp dissection and cautery to expose the multicystic spermatocele.  The right testis was delivered into the operative field.  The tunica vaginalis was adherent to the inferior portion of the right testis.  It was opened superiorly and carried over the multicystic spermatocele.  A portion of the hydrocele sac was excised.  Using a combination of blunt dissection, sharp dissection and cautery the 2 largest spermatoceles were dissected free and tied with a 3-0 Vicryl suture at its base.  2 other smaller  spermatoceles were then excised in a similar fashion.  Minimal native epididymis was identified post resection.  The testis was normal in appearance.  Due to the large spermatocele was elected to place a drain was placed through a separate stab incision in the dependent portion of the right hemiscrotum and was secured with 0 nylon.  A cord block was performed with quarter percent plain Sensorcaine.  The skin edges were anesthetized with quarter percent plain Sensorcaine.  The testis was delivered back into the right hemiscrotum in its anatomic position.  The dartos was closed with a running 3-0 Vicryl suture.  The skin was closed with a running 3-0 Monocryl suture horizontal mattress.  Bacitracin ointment was applied to the incision.  A scrotal turban dressing, gauze and scrotal support was applied.    After anesthetic reversal he was transported to the PACU in stable condition.    John Giovanni, M.D.

## 2017-05-28 NOTE — Progress Notes (Signed)
05/28/2017 10:30 AM   [name] 08/15/1950 [id number]  Referring provider: Birdie Sons, MD [address] [place], [place] [address]  Chief Complaint  Patient presents with  . Routine Post Op    HPI: Status post excision of a large right spermatocele on 05/24/2017.  He presents today for drain removal and has no complaints.   PMH: Past Medical History:  Diagnosis Date  . Spermatocele     Surgical History: Past Surgical History:  Procedure Laterality Date  . COLONOSCOPY W/ POLYPECTOMY  06/24/13  . HERNIA REPAIR    . SPERMATOCELECTOMY Right 05/24/2017   Procedure: SPERMATOCELECTOMY;  Surgeon: Abbie Sons, MD;  Location: [facility] ORS;  Service: Urology;  Laterality: Right;  . TONSILLECTOMY      Home Medications:  Allergies as of 05/28/2017   No Known Allergies     Medication List        Accurate as of 05/28/17 10:30 AM. Always use your most recent med list.          oxyCODONE-acetaminophen 5-325 MG tablet Commonly known as:  PERCOCET/ROXICET Take 1-2 tablets by mouth every 6 (six) hours as needed for severe pain.   sulfamethoxazole-trimethoprim 800-160 MG tablet Commonly known as:  BACTRIM DS,SEPTRA DS Take 1 tablet by mouth 2 (two) times daily.       Allergies: No Known Allergies  Family History: Family History  Problem Relation Age of Onset  . COPD Mother   . Heart attack Father   . Diabetes Father   . Aneurysm Brother   . Prostate cancer Neg Hx   . Kidney cancer Neg Hx   . Bladder Cancer Neg Hx     Social History:  reports that he quit smoking about 43 years ago. he has never used smokeless tobacco. He reports that he does not drink alcohol or use drugs.  ROS: UROLOGY Frequent Urination?: No Hard to postpone urination?: No Burning/pain with urination?: No Get up at night to urinate?: No Leakage of urine?: No Urine stream starts and stops?: No Trouble starting stream?: No Do you have to strain to urinate?: No Blood  in urine?: No Urinary tract infection?: No Sexually transmitted disease?: No Injury to kidneys or bladder?: No Painful intercourse?: No Weak stream?: No Erection problems?: No Penile pain?: No  Gastrointestinal Nausea?: No Vomiting?: No Indigestion/heartburn?: No Diarrhea?: No Constipation?: No  Constitutional Fever: No Night sweats?: No Weight loss?: No Fatigue?: No  Skin Skin rash/lesions?: No Itching?: No  Eyes Blurred vision?: No Double vision?: No  Ears/Nose/Throat Sore throat?: No Sinus problems?: Yes  Hematologic/Lymphatic Swollen glands?: No Easy bruising?: No  Cardiovascular Leg swelling?: No Chest pain?: No  Respiratory Cough?: Yes Shortness of breath?: No  Endocrine Excessive thirst?: No  Musculoskeletal Back pain?: No Joint pain?: No  Neurological Headaches?: No Dizziness?: No  Psychologic Depression?: No Anxiety?: No  Physical Exam: BP (!) 154/92 (BP Location: Left Arm, Patient Position: Sitting, Cuff Size: Normal)   Pulse 74   Ht 5\' 9"  (1.753 m)   Wt 190 lb 6.4 oz (86.4 kg)   BMI 28.12 kg/m   Constitutional:  Alert and oriented, No acute distress. HEENT: [place] AT, moist mucus membranes.  Trachea midline, no masses. Cardiovascular: No clubbing, cyanosis, or edema. Respiratory: Normal respiratory effort, no increased work of breathing. GI: Abdomen is soft, nontender, nondistended, no abdominal masses GU: No CVA tenderness.  Moderate scrotal edema without erythema.  No significant Penrose drainage noted.  The Penrose was removed.  Incision intact, clean. Skin: No rashes, bruises or suspicious lesions. Lymph: No cervical or inguinal adenopathy. Neurologic: Grossly intact, no focal deficits, moving all 4 extremities. Psychiatric: Normal mood and affect.   Assessment & Plan:  Doing well status post right spermatocelectomy.  No lifting greater than 10 pounds for 1 week.  He has a postop follow-up scheduled early January  2019.   Abbie Sons, [place] [address], [place], [place] [address] [phone]

## 2017-06-24 ENCOUNTER — Telehealth: Payer: Self-pay

## 2017-06-24 NOTE — Telephone Encounter (Signed)
Pt called for FYI- he states Medicare Initial Wellness will not cover EKG's; the insurance company will be faxing this over to be re-filed under diagnostic code.

## 2017-06-24 NOTE — Telephone Encounter (Signed)
Please review. Thanks!  

## 2017-06-24 NOTE — Telephone Encounter (Signed)
EKG with IPPE should be submitted as [id number]  (Electrocardiogram (ECG) performed as a screening for the IPPE (with interpretation and report) )  Can this be resubmnitted. Thanks.

## 2017-06-26 ENCOUNTER — Encounter: Payer: Self-pay | Admitting: Urology

## 2017-06-26 ENCOUNTER — Ambulatory Visit (INDEPENDENT_AMBULATORY_CARE_PROVIDER_SITE_OTHER): Payer: Medicare Other | Admitting: Urology

## 2017-06-26 VITALS — BP 144/92 | HR 69 | Ht 69.0 in | Wt 193.8 lb

## 2017-06-26 DIAGNOSIS — R350 Frequency of micturition: Secondary | ICD-10-CM

## 2017-06-26 DIAGNOSIS — Z9889 Other specified postprocedural states: Secondary | ICD-10-CM

## 2017-06-26 DIAGNOSIS — N529 Male erectile dysfunction, unspecified: Secondary | ICD-10-CM

## 2017-06-26 MED ORDER — TADALAFIL 20 MG PO TABS: 20.0000 mg | ORAL_TABLET | Freq: Every day | ORAL | 0 refills | Status: DC | PRN

## 2017-06-26 NOTE — Progress Notes (Signed)
06/26/2017 11:13 AM   Anthony Reynolds [birth year] [id number]  Referring provider: Birdie Sons, MD [address] [place], [place] [address]  Chief Complaint  Patient presents with  . Follow-up    Spermatocelectomy    HPI: 67 year old male status post excision of a large left spermatocele on 05/24/2017.  He had no postoperative problems.  He has mild tenderness in the upper cord but is doing his regular activities.  He states since his surgery he has noted increased urinary frequency and nocturia x1-2.  He denies dysuria or gross hematuria.  He also wanted to discuss erectile dysfunction today.  He complains of difficulty achieving and maintaining an erection.  He was interested in a PDE 5 inhibitor trial.  He denies use of oral or sublingual nitrates.   PMH: Past Medical History:  Diagnosis Date  . Spermatocele     Surgical History: Past Surgical History:  Procedure Laterality Date  . COLONOSCOPY W/ POLYPECTOMY  06/24/13  . HERNIA REPAIR    . SPERMATOCELECTOMY Right 05/24/2017   Procedure: SPERMATOCELECTOMY;  Surgeon: Abbie Sons, MD;  Location: [facility] ORS;  Service: Urology;  Laterality: Right;  . TONSILLECTOMY      Home Medications:  Allergies as of 06/26/2017   No Known Allergies     Medication List    as of 06/26/2017 11:13 AM   You have not been prescribed any medications.     Allergies: No Known Allergies  Family History: Family History  Problem Relation Age of Onset  . COPD Mother   . Heart attack Father   . Diabetes Father   . Aneurysm Brother   . Prostate cancer Neg Hx   . Kidney cancer Neg Hx   . Bladder Cancer Neg Hx     Social History:  reports that he quit smoking about 44 years ago. he has never used smokeless tobacco. He reports that he does not drink alcohol or use drugs.  ROS: UROLOGY Frequent Urination?: Yes Hard to postpone urination?: No Burning/pain with urination?: No Get up at night to urinate?: Yes Leakage of  urine?: No Urine stream starts and stops?: No Trouble starting stream?: No Do you have to strain to urinate?: No Blood in urine?: No Urinary tract infection?: No Sexually transmitted disease?: No Injury to kidneys or bladder?: No Painful intercourse?: No Weak stream?: No Erection problems?: Yes Penile pain?: No  Gastrointestinal Nausea?: No Vomiting?: No Indigestion/heartburn?: No Diarrhea?: No Constipation?: No  Constitutional Fever: No Night sweats?: No Weight loss?: No Fatigue?: No  Skin Skin rash/lesions?: No Itching?: No  Eyes Blurred vision?: No Double vision?: No  Ears/Nose/Throat Sore throat?: No Sinus problems?: No  Hematologic/Lymphatic Swollen glands?: No Easy bruising?: No  Cardiovascular Leg swelling?: No Chest pain?: No  Respiratory Cough?: No Shortness of breath?: No  Endocrine Excessive thirst?: No  Musculoskeletal Back pain?: No Joint pain?: No  Neurological Headaches?: No Dizziness?: No  Psychologic Depression?: No Anxiety?: No  Physical Exam: BP (!) 144/92 (BP Location: Right Arm, Patient Position: Sitting, Cuff Size: Normal)   Pulse 69   Ht 5\' 9"  (1.753 m)   Wt 193 lb 12.8 oz (87.9 kg)   BMI 28.62 kg/m   Constitutional:  Alert and oriented, No acute distress. HEENT: [place] AT, moist mucus membranes.  Trachea midline, no masses. Cardiovascular: No clubbing, cyanosis, or edema. Respiratory: Normal respiratory effort, no increased work of breathing. GI: Abdomen is soft, nontender, nondistended, no abdominal masses GU: No CVA tenderness.  Incision healed.  Mild scrotal swelling.  Mild tenderness and induration in the upper cord consistent with postoperative change.  Skin: No rashes, bruises or suspicious lesions. Lymph: No cervical or inguinal adenopathy. Neurologic: Grossly intact, no focal deficits, moving all 4 extremities. Psychiatric: Normal mood and affect.  Laboratory Data: Lab Results  Component Value Date   WBC  8.2 01/26/2014   HGB 13.7 01/26/2014   HCT 40.3 01/26/2014   MCV 94 01/26/2014   PLT 221 01/26/2014    Lab Results  Component Value Date   CREATININE 1.00 04/16/2016    Lab Results  Component Value Date   PSA1 1.0 04/16/2016   PSA1 0.9 04/14/2015     Assessment & Plan:    1.  Doing well status post spermatocelectomy.  2.  Urinary frequency noted since his procedure.  This most likely will return to baseline.  He was given samples of Myrbetriq 25 mg x 2 weeks and was instructed to call for worsening symptoms.  3.  We discussed treatment options for erectile dysfunction.  He was interested in PDE 5 inhibitor and Rx tadalafil was sent to his pharmacy.  Follow-up 3-4 months   Abbie Sons, MD  [facility] [address], [place], [place] [address] [phone]

## 2017-10-24 ENCOUNTER — Ambulatory Visit: Payer: Medicare Other | Admitting: Urology

## 2017-10-29 ENCOUNTER — Encounter: Payer: Self-pay | Admitting: Urology

## 2017-10-29 ENCOUNTER — Ambulatory Visit (INDEPENDENT_AMBULATORY_CARE_PROVIDER_SITE_OTHER): Payer: Medicare Other | Admitting: Urology

## 2017-10-29 VITALS — BP 143/88 | HR 67 | Resp 16 | Ht 69.0 in | Wt 196.1 lb

## 2017-10-29 DIAGNOSIS — R351 Nocturia: Secondary | ICD-10-CM | POA: Diagnosis not present

## 2017-10-29 DIAGNOSIS — N5201 Erectile dysfunction due to arterial insufficiency: Secondary | ICD-10-CM

## 2017-10-30 ENCOUNTER — Encounter: Payer: Self-pay | Admitting: Urology

## 2017-10-30 DIAGNOSIS — R351 Nocturia: Secondary | ICD-10-CM | POA: Insufficient documentation

## 2017-10-30 NOTE — Progress Notes (Signed)
10/29/2017 7:34 AM   Anthony Reynolds [birth year] [id number]  Referring provider: Birdie Sons, MD [address] [place], [place] [address]  Chief Complaint  Patient presents with  . Follow-up    HPI: 67 year old male who underwent an uneventful left spermatocelctomy in early December 2018.  At his initial postoperative visit he was complaining of increasing urinary frequency and nocturia x1-2.  He also was interested in treatment for erectile dysfunction and given a trial of tadalafil.  He has used this on one occasion which he states was effective.  He was also given a trial of Myrbetriq for his lower urinary tract symptoms and noted improvement in his urinary frequency but still has nocturia though it has improved.  He states he usually gets 4-6 hours of uninterrupted sleep then awakens at 3-4 AM and gets up hourly x2.  PMH: Past Medical History:  Diagnosis Date  . Spermatocele     Surgical History: Past Surgical History:  Procedure Laterality Date  . COLONOSCOPY W/ POLYPECTOMY  06/24/13  . HERNIA REPAIR    . SPERMATOCELECTOMY Right 05/24/2017   Procedure: SPERMATOCELECTOMY;  Surgeon: Abbie Sons, MD;  Location: [facility] ORS;  Service: Urology;  Laterality: Right;  . TONSILLECTOMY      Home Medications:  Allergies as of 10/29/2017   No Known Allergies     Medication List        Accurate as of 10/29/17 11:59 PM. Always use your most recent med list.          tadalafil 20 MG tablet Commonly known as:  CIALIS Take 1 tablet (20 mg total) by mouth daily as needed for erectile dysfunction.       Allergies: No Known Allergies  Family History: Family History  Problem Relation Age of Onset  . COPD Mother   . Heart attack Father   . Diabetes Father   . Aneurysm Brother   . Prostate cancer Neg Hx   . Kidney cancer Neg Hx   . Bladder Cancer Neg Hx     Social History:  reports that he quit smoking about 44 years ago. He has never used smokeless  tobacco. He reports that he does not drink alcohol or use drugs.  ROS: UROLOGY Frequent Urination?: No Hard to postpone urination?: No Burning/pain with urination?: No Get up at night to urinate?: No Leakage of urine?: No Urine stream starts and stops?: No Trouble starting stream?: No Do you have to strain to urinate?: No Blood in urine?: No Urinary tract infection?: No Sexually transmitted disease?: No Injury to kidneys or bladder?: No Painful intercourse?: No Weak stream?: No Erection problems?: No Penile pain?: No  Gastrointestinal Nausea?: No Vomiting?: No Indigestion/heartburn?: No Diarrhea?: No Constipation?: No  Constitutional Fever: No Night sweats?: No Weight loss?: No Fatigue?: No  Skin Skin rash/lesions?: No Itching?: No  Eyes Blurred vision?: No Double vision?: No  Ears/Nose/Throat Sore throat?: No Sinus problems?: No  Hematologic/Lymphatic Swollen glands?: No Easy bruising?: No  Cardiovascular Leg swelling?: No Chest pain?: No  Respiratory Cough?: No Shortness of breath?: No  Endocrine Excessive thirst?: No  Musculoskeletal Back pain?: No Joint pain?: No  Neurological Headaches?: No Dizziness?: No  Psychologic Depression?: No Anxiety?: No  Physical Exam: BP (!) 143/88   Pulse 67   Resp 16   Ht 5\' 9"  (1.753 m)   Wt 196 lb 1.6 oz (89 kg)   SpO2 98%   BMI 28.96 kg/m    Constitutional:  Alert and  oriented, No acute distress. HEENT: [place] AT, moist mucus membranes.  Trachea midline, no masses. Cardiovascular: No clubbing, cyanosis, or edema. Respiratory: Normal respiratory effort, no increased work of breathing. GI: Abdomen is soft, nontender, nondistended, no abdominal masses GU: No CVA tenderness Lymph: No cervical or inguinal lymphadenopathy. Skin: No rashes, bruises or suspicious lesions. Neurologic: Grossly intact, no focal deficits, moving all 4 extremities. Psychiatric: Normal mood and affect.   Assessment &  Plan:   67 year old male doing well status post spermatocelectomy.  He states his nocturia is not bothersome enough that he desires any additional management.  He will continue tadalafil as needed for ED.  Recommend annual follow-up and he was instructed to call earlier for any significant change in his voiding pattern.   Abbie Sons, [place] [address], [place], [place] [address] [phone]

## 2018-01-01 IMAGING — US US SCROTUM
1 series · 13 of 25 positions shown · non-contrast
Comparison: None.

CLINICAL DATA: Initial evaluation for right-sided hydrocele.

EXAM:
SCROTAL ULTRASOUND
DOPPLER ULTRASOUND OF THE TESTICLES
TECHNIQUE: Complete ultrasound examination of the testicles, epididymis, and
other scrotal structures was performed. Color and spectral Doppler
ultrasound were also utilized to evaluate blood flow to the
testicles.

[Series 1: us scrotum · 0.07mm/px · 13 of 70 slices shown]
[im 1/70]
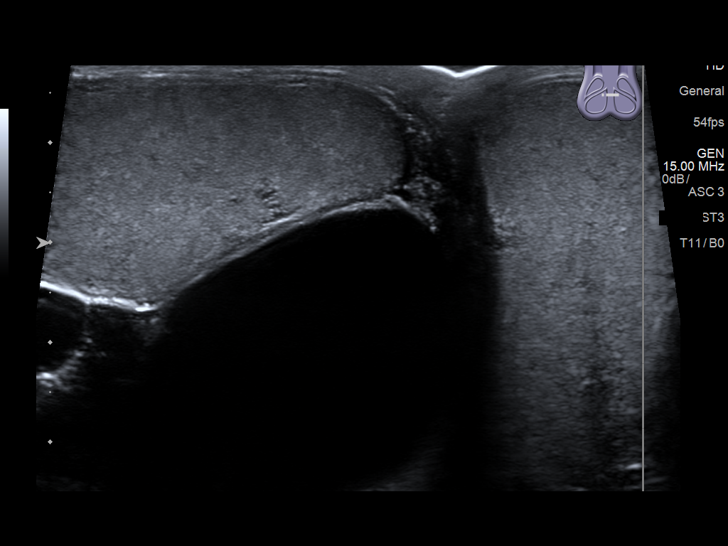
[im 6/70]
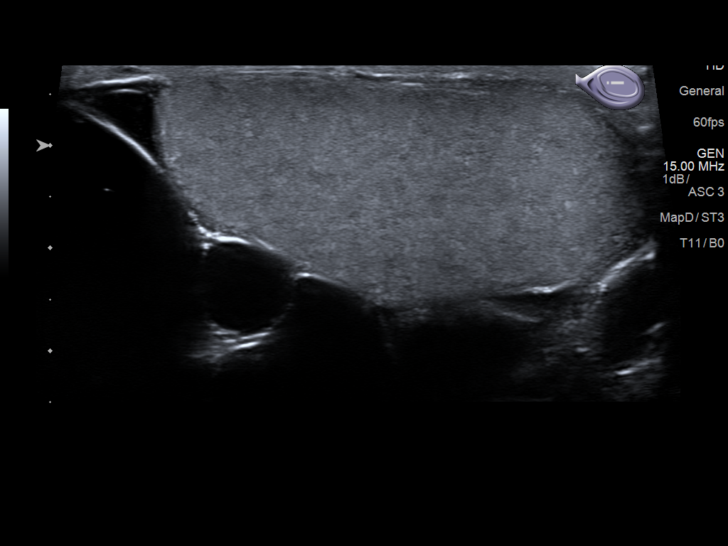
[im 12/70]
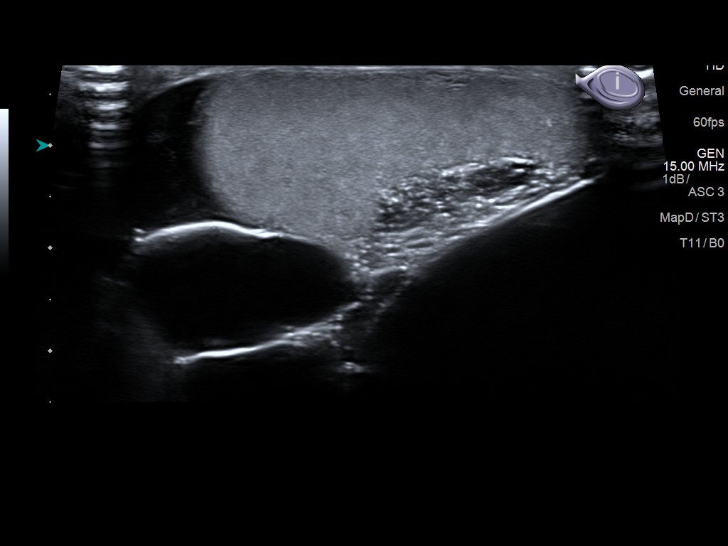
[im 18/70]
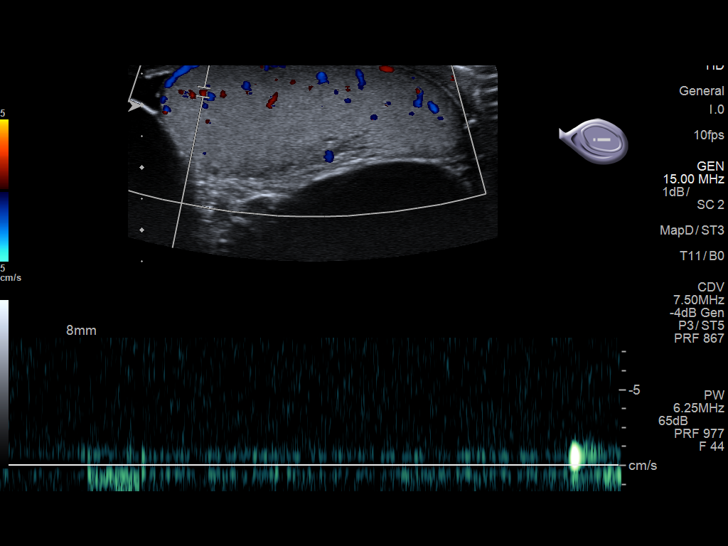
[im 24/70]
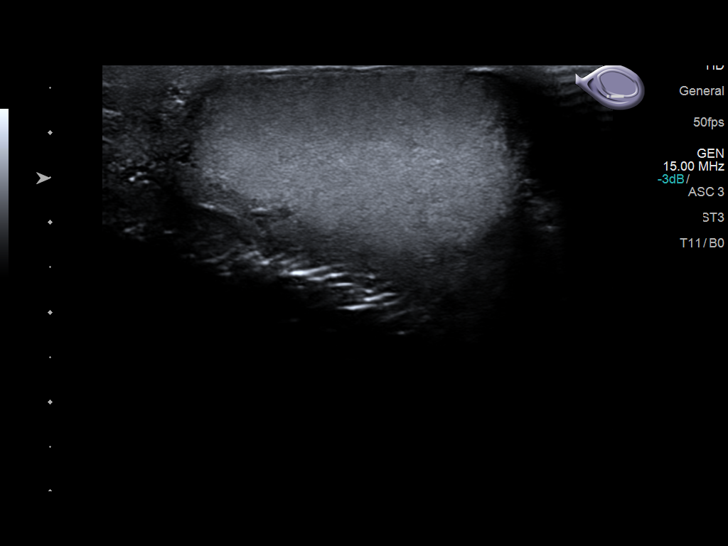
[im 29/70]
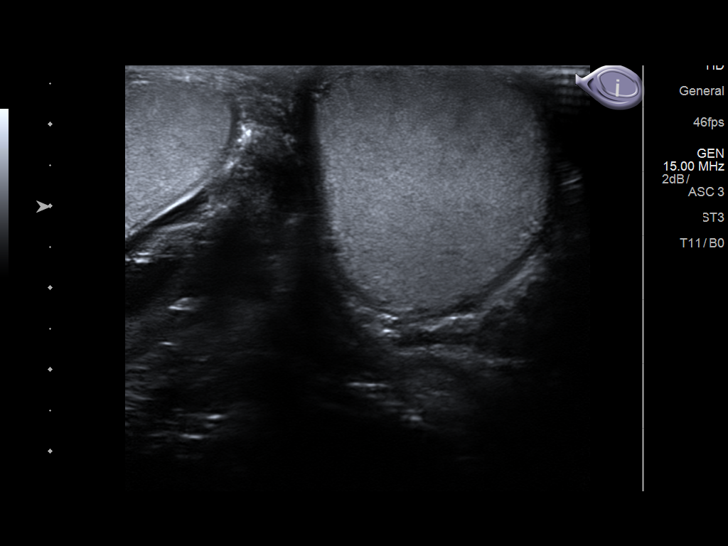
[im 35/70]
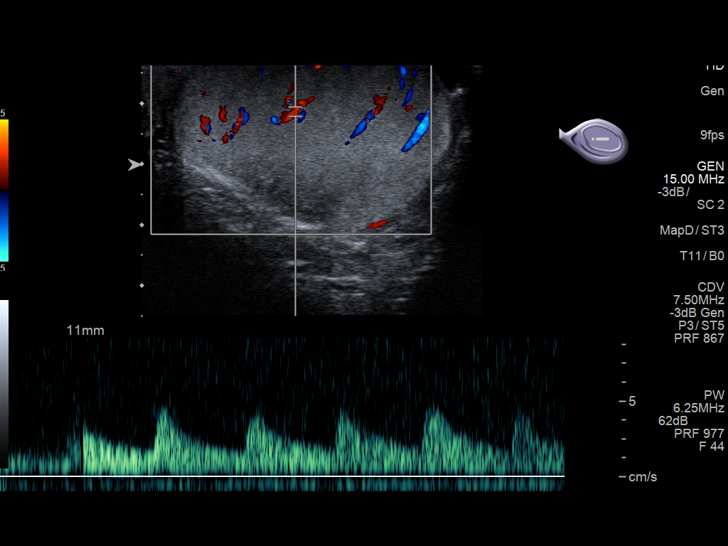
[im 41/70]
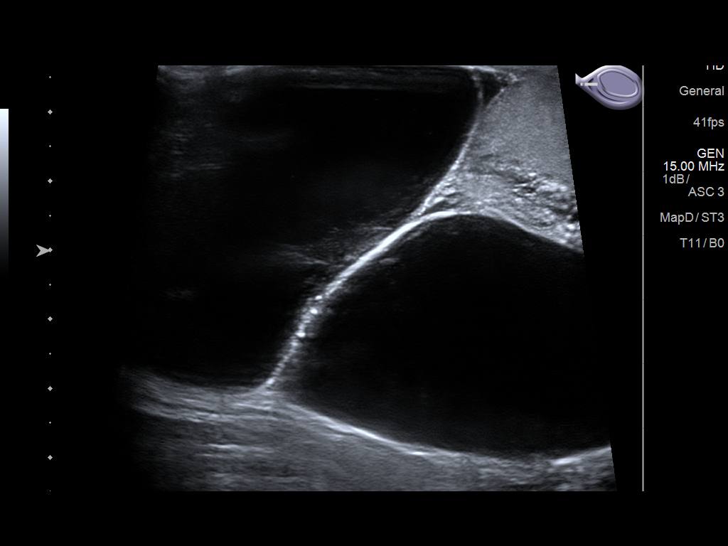
[im 47/70]
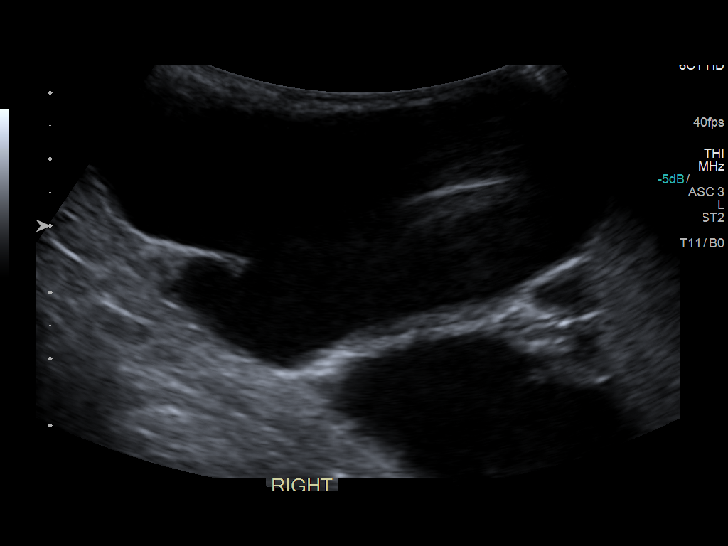
[im 52/70]
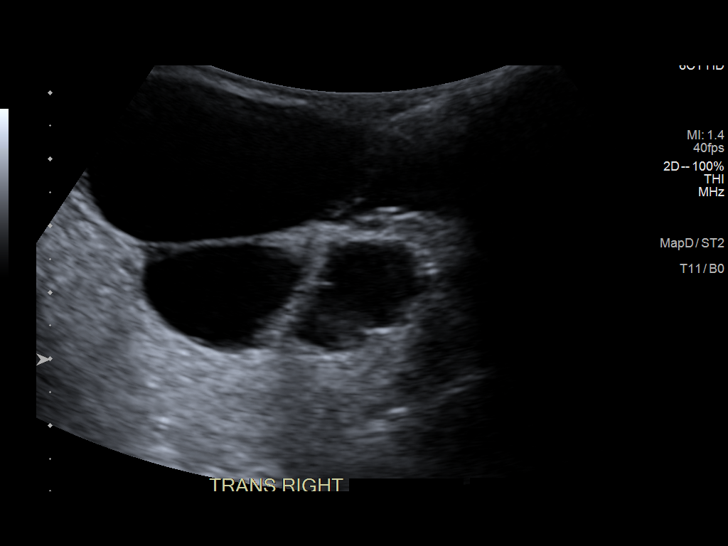
[im 58/70]
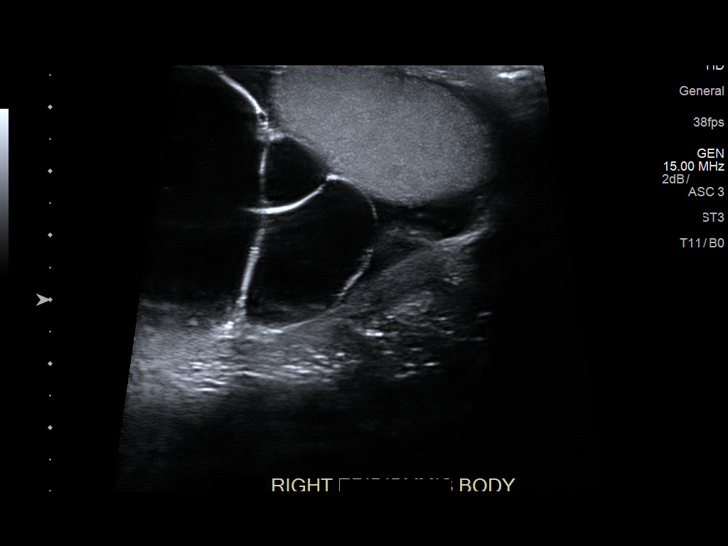
[im 64/70]
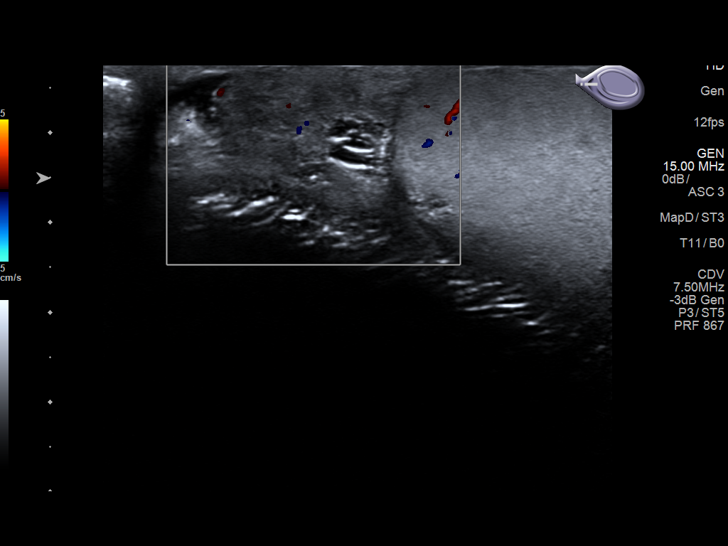
[im 70/70]
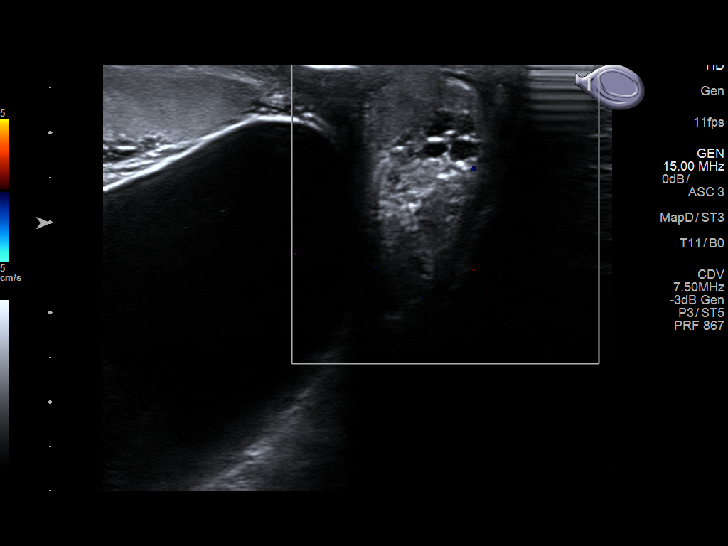

[13 of 25 positions shown; findings below may reference images not displayed]

FINDINGS: Right testicle

Measurements: 5.0 x 2.2 x 4.3 cm. No mass or microlithiasis
visualized.

Left testicle

Measurements: 4.7 x 2.6 x 2.9 cm. No mass or microlithiasis
visualized.

Right epididymis: Multiple large cysts seen replacing the head and
body of the right epididymis. Largest positioned at the right
epididymal head measures 8.2 x 3.3 x 4.7 cm. Second large cyst
positioned at the body measures 6.2 x 3.4 x 4.6 cm. Essentially
known native epididymal tissue is identified. The cysts are
predominantly simple and anechoic in nature, although complete tear
gestation somewhat difficult given their large size.

Left epididymis: Complex septated cyst measuring 7 x 7 x 9 mm
present at the left epididymal head. Left epididymis otherwise
normal in appearance.

Hydrocele:  Small bilateral hydroceles.

Varicocele:  None visualized.

Pulsed Doppler interrogation of both testes demonstrates normal low
resistance arterial and venous waveforms bilaterally.
IMPRESSION: 1. Multiple large cysts involving the head and body of the right
epididymis, largest of which measures 8.2 x 3.3 x 4.7 cm.
2. 7 x 7 x 9 mm mildly complex left epididymal head cyst.
3. Small bilateral hydroceles.
4. Normal sonographic appearance of the testicles.

## 2018-02-26 ENCOUNTER — Telehealth: Payer: Self-pay | Admitting: Family Medicine

## 2018-02-26 NOTE — Telephone Encounter (Signed)
Pt is scheduled for CPE on 04/23/18.  States she would like to have her labs done prior to appt.

## 2018-02-26 NOTE — Telephone Encounter (Signed)
Left message advising pt (per DPR).   Thanks,   -Laura  

## 2018-02-26 NOTE — Telephone Encounter (Signed)
Call back a week before CPE for lab order.

## 2018-02-26 NOTE — Telephone Encounter (Signed)
Please advise 

## 2018-03-14 ENCOUNTER — Telehealth: Payer: Self-pay

## 2018-03-14 NOTE — Telephone Encounter (Signed)
LM for pt to CB and schedule AWV prior to CPE on 04/23/18. -MM

## 2018-04-09 ENCOUNTER — Telehealth: Payer: Self-pay | Admitting: Family Medicine

## 2018-04-09 DIAGNOSIS — Z131 Encounter for screening for diabetes mellitus: Secondary | ICD-10-CM

## 2018-04-09 DIAGNOSIS — Z125 Encounter for screening for malignant neoplasm of prostate: Secondary | ICD-10-CM

## 2018-04-09 NOTE — Telephone Encounter (Signed)
Please advise 

## 2018-04-09 NOTE — Telephone Encounter (Signed)
Future order in EMR. He can stop by lab within a week of o.v.

## 2018-04-09 NOTE — Telephone Encounter (Signed)
Ps coming in on 11/6 for a physical and would like to have his labs done before that day so he can discuss his labs with Dr. Caryn Section on the day of his physical  Thanks   teri

## 2018-04-10 NOTE — Telephone Encounter (Signed)
Pt advised.   Thanks,   -Laura  

## 2018-04-14 ENCOUNTER — Telehealth: Payer: Self-pay

## 2018-04-14 NOTE — Telephone Encounter (Signed)
Called and spoke with wife (ok per DPR) and she stated pt was not in at the moment and would be back later. She inquired about the purpose of the AWV and I explained it. Offered the 230 apt before his 3 o clock CPE on 04/23/18. Pt to discuss with husband and CB. Direct number given to CB on.  -MM

## 2018-04-15 ENCOUNTER — Other Ambulatory Visit: Payer: Self-pay | Admitting: Family Medicine

## 2018-04-15 DIAGNOSIS — Z131 Encounter for screening for diabetes mellitus: Secondary | ICD-10-CM | POA: Diagnosis not present

## 2018-04-15 DIAGNOSIS — Z125 Encounter for screening for malignant neoplasm of prostate: Secondary | ICD-10-CM | POA: Diagnosis not present

## 2018-04-16 LAB — PSA: PROSTATE SPECIFIC AG, SERUM: 0.9 ng/mL (ref 0.0–4.0)

## 2018-04-16 LAB — GLUCOSE, RANDOM: GLUCOSE: 90 mg/dL (ref 65–99)

## 2018-04-18 NOTE — Telephone Encounter (Signed)
Scheduled for 04/23/18 @ 2:40 PM (pt will be here by 2:30) for AWV. -MM

## 2018-04-23 ENCOUNTER — Ambulatory Visit (INDEPENDENT_AMBULATORY_CARE_PROVIDER_SITE_OTHER): Payer: Medicare Other | Admitting: Family Medicine

## 2018-04-23 ENCOUNTER — Ambulatory Visit (INDEPENDENT_AMBULATORY_CARE_PROVIDER_SITE_OTHER): Payer: Medicare Other

## 2018-04-23 VITALS — BP 120/82 | HR 73 | Temp 98.8°F | Ht 69.0 in | Wt 189.4 lb

## 2018-04-23 DIAGNOSIS — G629 Polyneuropathy, unspecified: Secondary | ICD-10-CM

## 2018-04-23 DIAGNOSIS — Z0000 Encounter for general adult medical examination without abnormal findings: Secondary | ICD-10-CM | POA: Diagnosis not present

## 2018-04-23 DIAGNOSIS — Z23 Encounter for immunization: Secondary | ICD-10-CM

## 2018-04-23 DIAGNOSIS — L719 Rosacea, unspecified: Secondary | ICD-10-CM | POA: Diagnosis not present

## 2018-04-23 DIAGNOSIS — R200 Anesthesia of skin: Secondary | ICD-10-CM | POA: Diagnosis not present

## 2018-04-23 DIAGNOSIS — Z1211 Encounter for screening for malignant neoplasm of colon: Secondary | ICD-10-CM | POA: Diagnosis not present

## 2018-04-23 DIAGNOSIS — R202 Paresthesia of skin: Secondary | ICD-10-CM | POA: Diagnosis not present

## 2018-04-23 MED ORDER — METRONIDAZOLE 1 % EX GEL: Freq: Every day | CUTANEOUS | 2 refills | Status: DC

## 2018-04-23 NOTE — Patient Instructions (Signed)
[name] , Thank you for taking time to come for your Medicare Wellness Visit. I appreciate your ongoing commitment to your health goals. Please review the following plan we discussed and let me know if I can assist you in the future.   Screening recommendations/referrals: Colonoscopy: Due- referral sent today. Recommended yearly ophthalmology/optometry visit for glaucoma screening and checkup Recommended yearly dental visit for hygiene and checkup  Vaccinations: Influenza vaccine: Completed today. Pneumococcal vaccine: Completed series today.   Tdap vaccine: Pt declines today.  Shingles vaccine: Pt declines today.     Advanced directives: Please bring a copy of your POA (Power of Attorney) and/or Living Will to your next appointment.   Conditions/risks identified: Recommend to continue current diet plan of eating smaller portions and staying away from junk food or bread.   Next appointment: 3:00 PM today with Dr Anthony Reynolds.   Preventive Care 12 Years and Older, Male Preventive care refers to lifestyle choices and visits with your health care provider that can promote health and wellness. What does preventive care include?  A yearly physical exam. This is also called an annual well check.  Dental exams once or twice a year.  Routine eye exams. Ask your health care provider how often you should have your eyes checked.  Personal lifestyle choices, including:  Daily care of your teeth and gums.  Regular physical activity.  Eating a healthy diet.  Avoiding tobacco and drug use.  Limiting alcohol use.  Practicing safe sex.  Taking low doses of aspirin every day.  Taking vitamin and mineral supplements as recommended by your health care provider. What happens during an annual well check? The services and screenings done by your health care provider during your annual well check will depend on your age, overall health, lifestyle risk factors, and family history of  disease. Counseling  Your health care provider may ask you questions about your:  Alcohol use.  Tobacco use.  Drug use.  Emotional well-being.  Home and relationship well-being.  Sexual activity.  Eating habits.  History of falls.  Memory and ability to understand (cognition).  Work and work Statistician. Screening  You may have the following tests or measurements:  Height, weight, and BMI.  Blood pressure.  Lipid and cholesterol levels. These may be checked every 5 years, or more frequently if you are over 19 years old.  Skin check.  Lung cancer screening. You may have this screening every year starting at age 21 if you have a 30-pack-year history of smoking and currently smoke or have quit within the past 15 years.  Fecal occult blood test (FOBT) of the stool. You may have this test every year starting at age 17.  Flexible sigmoidoscopy or colonoscopy. You may have a sigmoidoscopy every 5 years or a colonoscopy every 10 years starting at age 37.  Prostate cancer screening. Recommendations will vary depending on your family history and other risks.  Hepatitis C blood test.  Hepatitis B blood test.  Sexually transmitted disease (STD) testing.  Diabetes screening. This is done by checking your blood sugar (glucose) after you have not eaten for a while (fasting). You may have this done every 1-3 years.  Abdominal aortic aneurysm (AAA) screening. You may need this if you are a current or former smoker.  Osteoporosis. You may be screened starting at age 31 if you are at high risk. Talk with your health care provider about your test results, treatment options, and if necessary, the need for more tests. Vaccines  Your health care provider may recommend certain vaccines, such as:  Influenza vaccine. This is recommended every year.  Tetanus, diphtheria, and acellular pertussis (Tdap, Td) vaccine. You may need a Td booster every 10 years.  Zoster vaccine. You may  need this after age 54.  Pneumococcal 13-valent conjugate (PCV13) vaccine. One dose is recommended after age 45.  Pneumococcal polysaccharide (PPSV23) vaccine. One dose is recommended after age 101. Talk to your health care provider about which screenings and vaccines you need and how often you need them. This information is not intended to replace advice given to you by your health care provider. Make sure you discuss any questions you have with your health care provider. Document Released: 07/01/2015 Document Revised: 02/22/2016 Document Reviewed: 04/05/2015 Elsevier Interactive Patient Education  2017 [place] Prevention in the Home Falls can cause injuries. They can happen to people of all ages. There are many things you can do to make your home safe and to help prevent falls. What can I do on the outside of my home?  Regularly fix the edges of walkways and driveways and fix any cracks.  Remove anything that might make you trip as you walk through a door, such as a raised step or threshold.  Trim any bushes or trees on the path to your home.  Use bright outdoor lighting.  Clear any walking paths of anything that might make someone trip, such as rocks or tools.  Regularly check to see if handrails are loose or broken. Make sure that both sides of any steps have handrails.  Any raised decks and porches should have guardrails on the edges.  Have any leaves, snow, or ice cleared regularly.  Use sand or salt on walking paths during winter.  Clean up any spills in your garage right away. This includes oil or grease spills. What can I do in the bathroom?  Use night lights.  Install grab bars by the toilet and in the tub and shower. Do not use towel bars as grab bars.  Use non-skid mats or decals in the tub or shower.  If you need to sit down in the shower, use a plastic, non-slip stool.  Keep the floor dry. Clean up any water that spills on the floor as soon as it  happens.  Remove soap buildup in the tub or shower regularly.  Attach bath mats securely with double-sided non-slip rug tape.  Do not have throw rugs and other things on the floor that can make you trip. What can I do in the bedroom?  Use night lights.  Make sure that you have a light by your bed that is easy to reach.  Do not use any sheets or blankets that are too big for your bed. They should not hang down onto the floor.  Have a firm chair that has side arms. You can use this for support while you get dressed.  Do not have throw rugs and other things on the floor that can make you trip. What can I do in the kitchen?  Clean up any spills right away.  Avoid walking on wet floors.  Keep items that you use a lot in easy-to-reach places.  If you need to reach something above you, use a strong step stool that has a grab bar.  Keep electrical cords out of the way.  Do not use floor polish or wax that makes floors slippery. If you must use wax, use non-skid floor wax.  Do  not have throw rugs and other things on the floor that can make you trip. What can I do with my stairs?  Do not leave any items on the stairs.  Make sure that there are handrails on both sides of the stairs and use them. Fix handrails that are broken or loose. Make sure that handrails are as long as the stairways.  Check any carpeting to make sure that it is firmly attached to the stairs. Fix any carpet that is loose or worn.  Avoid having throw rugs at the top or bottom of the stairs. If you do have throw rugs, attach them to the floor with carpet tape.  Make sure that you have a light switch at the top of the stairs and the bottom of the stairs. If you do not have them, ask someone to add them for you. What else can I do to help prevent falls?  Wear shoes that:  Do not have high heels.  Have rubber bottoms.  Are comfortable and fit you well.  Are closed at the toe. Do not wear sandals.  If you  use a stepladder:  Make sure that it is fully opened. Do not climb a closed stepladder.  Make sure that both sides of the stepladder are locked into place.  Ask someone to hold it for you, if possible.  Clearly mark and make sure that you can see:  Any grab bars or handrails.  First and last steps.  Where the edge of each step is.  Use tools that help you move around (mobility aids) if they are needed. These include:  Canes.  Walkers.  Scooters.  Crutches.  Turn on the lights when you go into a dark area. Replace any light bulbs as soon as they burn out.  Set up your furniture so you have a clear path. Avoid moving your furniture around.  If any of your floors are uneven, fix them.  If there are any pets around you, be aware of where they are.  Review your medicines with your doctor. Some medicines can make you feel dizzy. This can increase your chance of falling. Ask your doctor what other things that you can do to help prevent falls. This information is not intended to replace advice given to you by your health care provider. Make sure you discuss any questions you have with your health care provider. Document Released: 03/31/2009 Document Revised: 11/10/2015 Document Reviewed: 07/09/2014 Elsevier Interactive Patient Education  2017 [employer].

## 2018-04-23 NOTE — Progress Notes (Signed)
Patient: Anthony Reynolds Male    DOB: 1950/10/25   67 y.o.   MRN: [id number] Visit Date: 04/23/2018  Today's Provider: Lelon Huh, MD   Chief Complaint  Patient presents with  . Follow-up   Subjective:    HPI Follow up: Patient was last seen in the office 1 year ago.Labs were ordered during that visit and no changes were made. Patient recently had labs done on 04/15/2018, and is here to discuss results.   Results for orders placed or performed in visit on 04/15/18  PSA  Result Value Ref Range   Prostate Specific Ag, Serum 0.9 0.0 - 4.0 ng/mL  Glucose, random  Result Value Ref Range   Glucose 90 65 - 99 mg/dL   States he does still gets up 2-3 times a day to urinate.  He also reports that he has episodes of feeling like his feet or numb in tingling. Usually starts up after going to be at night and improves after getting up and about in the morning. Has been a few weeks since he had sx, but is pretty dramatic when it does occur.   He has also had rash on his cheeks and forehead the last couple of months. Is slightly itchy.     No Known Allergies   Current Outpatient Medications:  .  tadalafil (CIALIS) 20 MG tablet, Take 1 tablet (20 mg total) by mouth daily as needed for erectile dysfunction., Disp: 10 tablet, Rfl: 0  Review of Systems  Constitutional: Negative for appetite change, chills, fatigue and fever.  HENT: Negative for congestion, ear pain, hearing loss, nosebleeds and trouble swallowing.   Eyes: Negative for pain and visual disturbance.  Respiratory: Negative for cough, chest tightness and shortness of breath.   Cardiovascular: Negative for chest pain, palpitations and leg swelling.  Gastrointestinal: Negative for abdominal pain, blood in stool, constipation, diarrhea, nausea and vomiting.  Endocrine: Negative for polydipsia, polyphagia and polyuria.  Genitourinary: Negative for dysuria and flank pain.  Musculoskeletal: Positive for arthralgias.  Negative for back pain, joint swelling, myalgias and neck stiffness.  Skin: Positive for rash. Negative for color change and wound.  Neurological: Negative for dizziness, tremors, seizures, speech difficulty, weakness, light-headedness and headaches.  Psychiatric/Behavioral: Negative for behavioral problems, confusion, decreased concentration, dysphoric mood and sleep disturbance. The patient is not nervous/anxious.   All other systems reviewed and are negative.   Social History   Tobacco Use  . Smoking status: Former Smoker    Last attempt to quit: 06/18/1973    Years since quitting: 44.8  . Smokeless tobacco: Never Used  Substance Use Topics  . Alcohol use: No    Alcohol/week: 0.0 standard drinks   Objective:     Vitals:   BP 120/82 (BP Location: Right Arm)   Pulse 73   Temp 98.8 F (37.1 C) (Oral)   Ht 5\' 9"  (1.753 m)   Wt 189 lb 6.4 oz (85.9 kg)   BMI 27.97 kg/m   BSA 2.04 m   Pain [place] 0-No pain    Physical Exam   General Appearance:    Alert, cooperative, no distress  Eyes:    PERRL, conjunctiva/corneas clear, EOM's intact       Lungs:     Clear to auscultation bilaterally, respirations unlabored  Heart:    Regular rate and rhythm  Derm:   Dull red erythema and papules notes over malar prominence L>R and forehead c/w rosacea.   Neurologic:   Awake,  alert, oriented x 3. No apparent focal neurological           defect.   Ext:   3+ pedal pulses, no s/s deficits. Normal capillary refill. Some onychomycosis on all toenails.        Assessment & Plan:     1. Rosacea try- metroNIDAZOLE (METROGEL) 1 % gel; Apply topically daily.  Dispense: 45 g; Refill: 2 Refer derm if not much better within a month  2. Need for prophylactic vaccination using tetanus and diphtheria toxoids adsorbed (Td) vaccine  - Td : Tetanus/diphtheria >7yo Preservative  free  3. Neuropathy  - Vitamin B12 - TSH        Lelon Huh, MD  [facility]

## 2018-04-23 NOTE — Progress Notes (Signed)
Subjective:   Anthony Reynolds is a 67 y.o. male who presents for an Initial Medicare Annual Wellness Visit.  Review of Systems  N/A  Cardiac Risk Factors include: advanced age (>4men, >42 women);male gender    Objective:    Today's Vitals   04/23/18 1432  BP: 120/82  Pulse: 73  Temp: 98.8 F (37.1 C)  TempSrc: Oral  Weight: 189 lb 6.4 oz (85.9 kg)  Height: 5\' 9"  (1.753 m)  PainSc: 0-No pain   Body mass index is 27.97 kg/m.  Advanced Directives 04/23/2018 05/24/2017 05/17/2017 04/17/2017  Does Patient Have a Medical Advance Directive? Yes Yes Yes Yes  Type of Advance Directive Living will Healthcare Power of [place];Living will -  Does patient want to make changes to medical advance directive? - No - Patient declined - -  Copy of [place] in Chart? - - No - copy requested -    Current Medications (verified) Outpatient Encounter Medications as of 04/23/2018  Medication Sig  . tadalafil (CIALIS) 20 MG tablet Take 1 tablet (20 mg total) by mouth daily as needed for erectile dysfunction.   No facility-administered encounter medications on file as of 04/23/2018.     Allergies (verified) Patient has no known allergies.   History: Past Medical History:  Diagnosis Date  . Spermatocele    Past Surgical History:  Procedure Laterality Date  . COLONOSCOPY W/ POLYPECTOMY  06/24/13  . HERNIA REPAIR    . SPERMATOCELECTOMY Right 05/24/2017   Procedure: SPERMATOCELECTOMY;  Surgeon: Abbie Sons, MD;  Location: [facility] ORS;  Service: Urology;  Laterality: Right;  . TONSILLECTOMY     Family History  Problem Relation Age of Onset  . COPD Mother   . Heart attack Father   . Diabetes Father   . Aneurysm Brother   . Prostate cancer Neg Hx   . Kidney cancer Neg Hx   . Bladder Cancer Neg Hx    Social History   Socioeconomic History  . Marital status: Married    Spouse name: Not on file  . Number of children: 2  . Years of  education: Not on file  . Highest education level: Some college, no degree  Occupational History  . Occupation: Self Employed    Comment: Farming  Social Needs  . Financial resource strain: Not hard at all  . Food insecurity:    Worry: Never true    Inability: Never true  . Transportation needs:    Medical: No    Non-medical: No  Tobacco Use  . Smoking status: Former Smoker    Last attempt to quit: 06/18/1973    Years since quitting: 44.8  . Smokeless tobacco: Never Used  Substance and Sexual Activity  . Alcohol use: No    Alcohol/week: 0.0 standard drinks  . Drug use: No  . Sexual activity: Not on file  Lifestyle  . Physical activity:    Days per week: 0 days    Minutes per session: 0 min  . Stress: Not at all  Relationships  . Social connections:    Talks on phone: Patient refused    Gets together: Patient refused    Attends religious service: Patient refused    Active member of club or organization: Patient refused    Attends meetings of clubs or organizations: Patient refused    Relationship status: Patient refused  Other Topics Concern  . Not on file  Social History Narrative  . Not on file  Tobacco Counseling Counseling given: Not Answered   Clinical Intake:  Pre-visit preparation completed: Yes  Pain : No/denies pain Pain Score: 0-No pain     Nutritional Status: BMI 25 -29 Overweight Nutritional Risks: None Diabetes: No  How often do you need to have someone help you when you read instructions, pamphlets, or other written materials from your doctor or pharmacy?: 1 - Never  Interpreter Needed?: No  Information entered by :: [facility], LPN  Activities of Daily Living In your present state of health, do you have any difficulty performing the following activities: 04/23/2018 05/17/2017  Hearing? N N  Vision? N N  Difficulty concentrating or making decisions? N N  Walking or climbing stairs? N N  Dressing or bathing? N N  Doing errands, shopping?  N N  Preparing Food and eating ? N -  Using the Toilet? N -  In the past six months, have you accidently leaked urine? N -  Do you have problems with loss of bowel control? N -  Managing your Medications? N -  Managing your Finances? N -  Housekeeping or managing your Housekeeping? N -  Some recent data might be hidden     Immunizations and Health Maintenance Immunization History  Administered Date(s) Administered  . Hepatitis A, Adult 04/09/2014, 04/14/2015  . Influenza Split 04/04/2012  . Influenza, High Dose Seasonal PF 04/17/2017, 04/23/2018  . Influenza,inj,Quad PF,6+ Mos 04/08/2013, 04/09/2014, 04/14/2015, 04/16/2016  . Pneumococcal Conjugate-13 04/17/2017  . Pneumococcal Polysaccharide-23 04/23/2018  . Tdap 07/08/2007  . Zoster 04/04/2012   Health Maintenance Due  Topic Date Due  . COLONOSCOPY  06/24/2016  . TETANUS/TDAP  07/07/2017    Patient Care Team: Birdie Sons, MD as PCP - General (Family Medicine) Anell Barr, OD (Optometry) Bernardo Heater, Ronda Fairly, MD (Urology)  Indicate any recent Medical Services you may have received from other than [facility] providers in the past year (date may be approximate).    Assessment:   This is a routine wellness examination for Anthony Reynolds.  Hearing/Vision screen No exam data present  Dietary issues and exercise activities discussed: Current Exercise Habits: The patient does not participate in regular exercise at present;The patient has a physically strenous job, but has no regular exercise apart from work.(Farms for a living. )  Goals    . DIET - REDUCE PORTION SIZE     Recommend to continue current diet plan of eating smaller portions and staying away from junk food or bread.       Depression Screen PHQ 2/9 Scores 04/23/2018 04/23/2018 04/17/2017 04/16/2016  PHQ - 2 Score 0 0 0 0  PHQ- 9 Score 0 - 0 0    Fall Risk Fall Risk  04/23/2018 04/17/2017 04/16/2016  Falls in the past year? 0 No No    FALL RISK PREVENTION  PERTAINING TO THE HOME:  Any stairs in or around the home WITH handrails? Yes  Home free of loose throw rugs in walkways, pet beds, electrical cords, etc? Yes  Adequate lighting in your home to reduce risk of falls? Yes   ASSISTIVE DEVICES UTILIZED TO PREVENT FALLS:  Life alert? No  Use of a cane, walker or w/c? No  Grab bars in the bathroom? No  Shower chair or bench in shower? No  Elevated toilet seat or a handicapped toilet? No    TIMED UP AND GO:  Was the test performed? No .     Cognitive Function: Declined today.  Screening Tests Health Maintenance  Topic Date Due  . COLONOSCOPY  06/24/2016  . TETANUS/TDAP  07/07/2017  . INFLUENZA VACCINE  Completed  . Hepatitis C Screening  Completed  . PNA vac Low Risk Adult  Completed    Qualifies for Shingles Vaccine? Yes . Due for Shingrix. Education has been provided regarding the importance of this vaccine. Pt has been advised to call insurance company to determine out of pocket expense. Advised may also receive vaccine at local pharmacy or Health Dept. Verbalized acceptance and understanding.  Tdap: Although this vaccine is not a covered service during a Wellness Exam, does the patient still wish to receive this vaccine today?  No .  Education has been provided regarding the importance of this vaccine. Advised may receive this vaccine at local pharmacy or Health Dept. Aware to provide a copy of the vaccination record if obtained from local pharmacy or Health Dept. Verbalized acceptance and understanding.  Flu Vaccine: Due for Flu vaccine. Does the patient want to receive this vaccine today?  Yes .   Pneumococcal Vaccine: Due for Pneumococcal vaccine. Does the patient want to receive this vaccine today?  Yes .    Cancer Screenings:  Colorectal Screening: Completed 06/24/13. Repeat every 3 years. Referral to GI placed today. Pt aware the office will call re: appt.  Lung Cancer Screening: (Low Dose CT Chest  recommended if Age 36-80 years, 30 pack-year currently smoking OR have quit w/in 15years.) does not qualify.    Additional Screening:  Hepatitis C Screening: Up to date  Vision Screening: Recommended annual ophthalmology exams for early detection of glaucoma and other disorders of the eye.  Dental Screening: Recommended annual dental exams for proper oral hygiene  Community Resource Referral:  CRR required this visit?  No       Plan:  I have personally reviewed and addressed the Medicare Annual Wellness questionnaire and have noted the following in the patient's chart:  A. Medical and social history B. Use of alcohol, tobacco or illicit drugs  C. Current medications and supplements D. Functional ability and status E.  Nutritional status F.  Physical activity G. Advance directives H. List of other physicians I.  Hospitalizations, surgeries, and ER visits in previous 12 months J.  [place] such as hearing and vision if needed, cognitive and depression L. Referrals and appointments - none  In addition, I have reviewed and discussed with patient certain preventive protocols, quality metrics, and best practice recommendations. A written personalized care plan for preventive services as well as general preventive health recommendations were provided to patient.  See attached scanned questionnaire for additional information.   Signed,  Fabio Neighbors, LPN Nurse Health Advisor   Nurse Recommendations: Colonoscopy referral sent today. Pt declined the tetanus vaccine and would like to speak further with PCP about this.

## 2018-04-24 ENCOUNTER — Telehealth: Payer: Self-pay

## 2018-04-24 ENCOUNTER — Telehealth: Payer: Self-pay | Admitting: Urology

## 2018-04-24 DIAGNOSIS — N5201 Erectile dysfunction due to arterial insufficiency: Secondary | ICD-10-CM

## 2018-04-24 LAB — TSH: TSH: 3.12 u[IU]/mL (ref 0.450–4.500)

## 2018-04-24 LAB — VITAMIN B12: VITAMIN B 12: 282 pg/mL (ref 232–1245)

## 2018-04-24 NOTE — Telephone Encounter (Signed)
Pt advised.   Thanks,   -Laura  

## 2018-04-24 NOTE — Telephone Encounter (Signed)
Patient is requesting a refill for his prescription of Cialis to be sent to the [facility] on [address].  Please contact patient with any concerns.

## 2018-04-24 NOTE — Telephone Encounter (Signed)
-----   Message from Birdie Sons, MD sent at 04/24/2018  7:58 AM EST ----- Can also try vitamin D3 1,000 units a day.

## 2018-04-25 MED ORDER — TADALAFIL 20 MG PO TABS: 20.0000 mg | ORAL_TABLET | Freq: Every day | ORAL | 6 refills | Status: DC | PRN

## 2018-04-25 NOTE — Telephone Encounter (Signed)
Pt seen within the year. RX sent.

## 2018-04-28 NOTE — Telephone Encounter (Signed)
Completed AWV 04/23/18. -MM

## 2018-05-28 DIAGNOSIS — Z86010 Personal history of colonic polyps: Secondary | ICD-10-CM | POA: Diagnosis not present

## 2018-06-26 DIAGNOSIS — D124 Benign neoplasm of descending colon: Secondary | ICD-10-CM | POA: Diagnosis not present

## 2018-06-26 DIAGNOSIS — Z86010 Personal history of colonic polyps: Secondary | ICD-10-CM | POA: Diagnosis not present

## 2018-06-26 DIAGNOSIS — K621 Rectal polyp: Secondary | ICD-10-CM | POA: Diagnosis not present

## 2018-06-26 DIAGNOSIS — D125 Benign neoplasm of sigmoid colon: Secondary | ICD-10-CM | POA: Diagnosis not present

## 2018-06-26 DIAGNOSIS — K635 Polyp of colon: Secondary | ICD-10-CM | POA: Diagnosis not present

## 2018-06-26 DIAGNOSIS — D122 Benign neoplasm of ascending colon: Secondary | ICD-10-CM | POA: Diagnosis not present

## 2018-06-26 DIAGNOSIS — D123 Benign neoplasm of transverse colon: Secondary | ICD-10-CM | POA: Diagnosis not present

## 2018-06-26 LAB — HM COLONOSCOPY

## 2018-07-14 ENCOUNTER — Encounter: Payer: Self-pay | Admitting: Family Medicine

## 2018-07-14 ENCOUNTER — Ambulatory Visit (INDEPENDENT_AMBULATORY_CARE_PROVIDER_SITE_OTHER): Payer: Medicare Other | Admitting: Family Medicine

## 2018-07-14 VITALS — BP 150/94 | HR 70 | Temp 97.9°F | Resp 18 | Wt 190.0 lb

## 2018-07-14 DIAGNOSIS — M79672 Pain in left foot: Secondary | ICD-10-CM

## 2018-07-14 DIAGNOSIS — R51 Headache: Secondary | ICD-10-CM

## 2018-07-14 DIAGNOSIS — R1084 Generalized abdominal pain: Secondary | ICD-10-CM | POA: Diagnosis not present

## 2018-07-14 DIAGNOSIS — Z860100 Personal history of colon polyps, unspecified: Secondary | ICD-10-CM

## 2018-07-14 DIAGNOSIS — M79671 Pain in right foot: Secondary | ICD-10-CM | POA: Diagnosis not present

## 2018-07-14 DIAGNOSIS — R519 Headache, unspecified: Secondary | ICD-10-CM

## 2018-07-14 MED ORDER — HYOSCYAMINE SULFATE 0.125 MG SL SUBL
0.1250 mg | SUBLINGUAL_TABLET | SUBLINGUAL | 1 refills | Status: DC | PRN
Start: 2018-07-14 — End: 2019-04-28

## 2018-07-14 NOTE — Patient Instructions (Signed)
.   Please review the attached list of medications and notify my office if there are any errors.   . Please bring all of your medications to every appointment so we can make sure that our medication list is the same as yours.   

## 2018-07-14 NOTE — Progress Notes (Signed)
Patient: Anthony Reynolds Male    DOB: 04-13-1951   68 y.o.   MRN: [id number] Visit Date: 07/14/2018  Today's Provider: Lelon Huh, MD   Chief Complaint  Patient presents with  . Headache  . Abdominal Pain   Subjective:     Headache   This is a new problem. Episode onset: 2 weeks ago. The problem occurs intermittently. The problem has been unchanged. Associated symptoms include abdominal pain, dizziness and numbness (around lips). Pertinent negatives include no fever, muscle aches, nausea or vomiting. Treatments tried: asprin and hot showers. The treatment provided moderate relief.  Abdominal Pain  This is a new problem. Episode onset: 2 weeks ago; symptoms appeared after having his colonoscopy. The problem occurs intermittently. The problem has been unchanged. The pain is located in the generalized abdominal region. Quality: twisting pain. The abdominal pain does not radiate. Associated symptoms include headaches. Pertinent negatives include no fever, nausea or vomiting. Associated symptoms comments: Home blood pressure reading as high as 160/92. Exacerbated by: empty stomach. Treatments tried: resting. The treatment provided mild relief.  He states that headaches seem to occur at the same times as abdominal pains. They are bilateral and usually go away when stomach pain resolves.   He also requests referral to podiatry for persistent numbness in his feet. They do not sting or burn, but often gives him trouble walking. He has normal b12 levels. Tried b-complex vitamins and vitamin d with out improvement.   Office Visit on 07/14/2018  Component Date Value Ref Range Status  . Glucose 07/14/2018 88  65 - 99 mg/dL Final  . BUN 07/14/2018 15  8 - 27 mg/dL Final  . Creatinine, Ser 07/14/2018 1.04  0.76 - 1.27 mg/dL Final  . GFR calc non Af Amer 07/14/2018 74  >59 mL/min/1.73 Final  . GFR calc Af Amer 07/14/2018 86  >59 mL/min/1.73 Final  . BUN/Creatinine Ratio 07/14/2018 14  10 - 24  Final  . Sodium 07/14/2018 140  134 - 144 mmol/L Final  . Potassium 07/14/2018 4.2  3.5 - 5.2 mmol/L Final  . Chloride 07/14/2018 101  96 - 106 mmol/L Final  . CO2 07/14/2018 23  20 - 29 mmol/L Final  . Calcium 07/14/2018 9.4  8.6 - 10.2 mg/dL Final  . Total Protein 07/14/2018 6.3  6.0 - 8.5 g/dL Final  . Albumin 07/14/2018 4.3  3.8 - 4.8 g/dL Final                 **Please note reference interval change**  . Globulin, Total 07/14/2018 2.0  1.5 - 4.5 g/dL Final  . Albumin/Globulin Ratio 07/14/2018 2.2  1.2 - 2.2 Final  . Bilirubin Total 07/14/2018 0.4  0.0 - 1.2 mg/dL Final  . Alkaline Phosphatase 07/14/2018 69  39 - 117 IU/L Final  . AST 07/14/2018 14  0 - 40 IU/L Final  . ALT 07/14/2018 7  0 - 44 IU/L Final  . WBC 07/14/2018 8.1  3.4 - 10.8 x10E3/uL Final  . RBC 07/14/2018 4.33  4.14 - 5.80 x10E6/uL Final  . Hemoglobin 07/14/2018 13.8  13.0 - 17.7 g/dL Final  . Hematocrit 07/14/2018 39.8  37.5 - 51.0 % Final  . MCV 07/14/2018 92  79 - 97 fL Final  . MCH 07/14/2018 31.9  26.6 - 33.0 pg Final  . MCHC 07/14/2018 34.7  31.5 - 35.7 g/dL Final  . RDW 07/14/2018 12.7  11.6 - 15.4 % Final  . Platelets 07/14/2018 290  150 - 450 x10E3/uL Final  Office Visit on 04/23/2018  Component Date Value Ref Range Status  . Vitamin B-12 04/23/2018 282  232 - 1,245 pg/mL Final  . TSH 04/23/2018 3.120  0.450 - 4.500 uIU/mL Final  Orders Only on 04/15/2018  Component Date Value Ref Range Status  . Glucose 04/15/2018 90  65 - 99 mg/dL Final     No Known Allergies   Current Outpatient Medications:  .  metroNIDAZOLE (METROGEL) 1 % gel, Apply topically daily. (Patient not taking: Reported on 07/14/2018), Disp: 45 g, Rfl: 2 .  tadalafil (ADCIRCA/CIALIS) 20 MG tablet, Take 1 tablet (20 mg total) by mouth daily as needed for erectile dysfunction. (Patient not taking: Reported on 07/14/2018), Disp: 10 tablet, Rfl: 6  Review of Systems  Constitutional: Negative for appetite change, chills and fever.  Eyes:  Negative for visual disturbance.  Respiratory: Negative for chest tightness, shortness of breath and wheezing.   Cardiovascular: Negative for chest pain and palpitations.  Gastrointestinal: Positive for abdominal pain. Negative for nausea and vomiting.  Neurological: Positive for dizziness, numbness (around lips) and headaches.    Social History   Tobacco Use  . Smoking status: Former Smoker    Last attempt to quit: 06/18/1973    Years since quitting: 45.1  . Smokeless tobacco: Never Used  Substance Use Topics  . Alcohol use: No    Alcohol/week: 0.0 standard drinks      Objective:   BP (!) 150/94 (BP Location: Right Arm, Cuff Size: Large)   Pulse 70   Temp 97.9 F (36.6 C) (Oral)   Resp 18   Wt 190 lb (86.2 kg)   SpO2 98% Comment: room air  BMI 28.06 kg/m  Vitals:   07/14/18 1343 07/14/18 1353  BP: (!) 148/92 (!) 150/94  Pulse: 70   Resp: 18   Temp: 97.9 F (36.6 C)   TempSrc: Oral   SpO2: 98%   Weight: 190 lb (86.2 kg)    BP Readings from Last 5 Encounters:  07/14/18 (!) 150/94  04/23/18 120/82  10/29/17 (!) 143/88  06/26/17 (!) 144/92  05/28/17 (!) 154/92     Physical Exam  General Appearance:    Alert, cooperative, no distress  Eyes:    PERRL, conjunctiva/corneas clear, EOM's intact       Lungs:     Clear to auscultation bilaterally, respirations unlabored  Heart:    Regular rate and rhythm  Abdomen:   bowel sounds present and normal in all 4 quadrants, soft, round, nontender or nondistended. No CVA tenderness       Assessment & Plan    1. Generalized abdominal pain Ongoing since recent colonoscopy. Had similar sx in past which he states responded to medication for spasm. Will try- hyoscyamine (LEVSIN SL) 0.125 MG SL tablet; Place 1 tablet (0.125 mg total) under the tongue every 4 (four) hours as needed.  Dispense: 30 tablet; Refill: 1 - Comprehensive metabolic panel - CBC  2. Nonintractable headache, unspecified chronicity pattern, unspecified  headache type Intermittent, but no red flags. Onset seems to coincide with onset of abdominal pains.   3. . Foot pain, bilateral He requests evaluation by podiatry. Sounds like neuropathy, consider referral to neurology.  - Ambulatory referral to [place], MD  [facility]

## 2018-07-15 LAB — COMPREHENSIVE METABOLIC PANEL
A/G RATIO: 2.2 (ref 1.2–2.2)
ALK PHOS: 69 IU/L (ref 39–117)
ALT: 7 IU/L (ref 0–44)
AST: 14 IU/L (ref 0–40)
Albumin: 4.3 g/dL (ref 3.8–4.8)
BUN/Creatinine Ratio: 14 (ref 10–24)
BUN: 15 mg/dL (ref 8–27)
Bilirubin Total: 0.4 mg/dL (ref 0.0–1.2)
CO2: 23 mmol/L (ref 20–29)
Calcium: 9.4 mg/dL (ref 8.6–10.2)
Chloride: 101 mmol/L (ref 96–106)
Creatinine, Ser: 1.04 mg/dL (ref 0.76–1.27)
GFR calc Af Amer: 86 mL/min/{1.73_m2} (ref >59)
GFR calc non Af Amer: 74 mL/min/{1.73_m2} (ref >59)
GLOBULIN, TOTAL: 2 g/dL (ref 1.5–4.5)
Glucose: 88 mg/dL (ref 65–99)
POTASSIUM: 4.2 mmol/L (ref 3.5–5.2)
SODIUM: 140 mmol/L (ref 134–144)
Total Protein: 6.3 g/dL (ref 6.0–8.5)

## 2018-07-15 LAB — CBC
HEMOGLOBIN: 13.8 g/dL (ref 13.0–17.7)
Hematocrit: 39.8 % (ref 37.5–51.0)
MCH: 31.9 pg (ref 26.6–33.0)
MCHC: 34.7 g/dL (ref 31.5–35.7)
MCV: 92 fL (ref 79–97)
PLATELETS: 290 10*3/uL (ref 150–450)
RBC: 4.33 x10E6/uL (ref 4.14–5.80)
RDW: 12.7 % (ref 11.6–15.4)
WBC: 8.1 10*3/uL (ref 3.4–10.8)

## 2018-07-16 ENCOUNTER — Telehealth: Payer: Self-pay | Admitting: Family Medicine

## 2018-07-16 ENCOUNTER — Encounter: Payer: Self-pay | Admitting: Family Medicine

## 2018-07-16 DIAGNOSIS — Z860100 Personal history of colon polyps, unspecified: Secondary | ICD-10-CM

## 2018-07-16 NOTE — Telephone Encounter (Signed)
Pt needing results from his labs.  Please advise.  Thanks, [employer]

## 2018-07-30 DIAGNOSIS — B351 Tinea unguium: Secondary | ICD-10-CM | POA: Diagnosis not present

## 2018-07-30 DIAGNOSIS — G5792 Unspecified mononeuropathy of left lower limb: Secondary | ICD-10-CM | POA: Diagnosis not present

## 2018-07-30 DIAGNOSIS — G5791 Unspecified mononeuropathy of right lower limb: Secondary | ICD-10-CM | POA: Diagnosis not present

## 2018-09-04 DIAGNOSIS — G5792 Unspecified mononeuropathy of left lower limb: Secondary | ICD-10-CM | POA: Diagnosis not present

## 2018-09-04 DIAGNOSIS — G5791 Unspecified mononeuropathy of right lower limb: Secondary | ICD-10-CM | POA: Diagnosis not present

## 2018-10-30 ENCOUNTER — Telehealth: Payer: Medicare Other | Admitting: Urology

## 2019-04-22 ENCOUNTER — Telehealth: Payer: Self-pay | Admitting: Family Medicine

## 2019-04-22 DIAGNOSIS — Z131 Encounter for screening for diabetes mellitus: Secondary | ICD-10-CM

## 2019-04-22 DIAGNOSIS — Z125 Encounter for screening for malignant neoplasm of prostate: Secondary | ICD-10-CM

## 2019-04-22 NOTE — Telephone Encounter (Signed)
Pt has a CPE appt on 11/10 at 3:00.  Pt wants to come in the morning before the appt to have his labs done.  Please call pt back to let him know if this can be done at [phone].  Thanks, [employer]

## 2019-04-25 NOTE — Telephone Encounter (Signed)
OK to print order, but not sure if he will get message before his appointment

## 2019-04-27 NOTE — Progress Notes (Signed)
Subjective:   Anthony Reynolds is a 68 y.o. male who presents for Medicare Annual/Subsequent preventive examination.    This visit is being conducted through telemedicine due to the COVID-19 pandemic. This patient has given me verbal consent via doximity to conduct this visit, patient states they are participating from their home address. Some vital signs may be absent or patient reported.    Patient identification: identified by name, DOB, and current address  Review of Systems:  N/A  Cardiac Risk Factors include: advanced age (>82men, >91 women);hypertension     Objective:    Vitals: There were no vitals taken for this visit.  There is no height or weight on file to calculate BMI. Unable to obtain vitals due to visit being conducted via telephonically.   Advanced Directives 04/28/2019 04/23/2018 05/24/2017 05/17/2017 04/17/2017  Does Patient Have a Medical Advance Directive? Yes Yes Yes Yes Yes  Type of Paramedic of [place];Living will Living will Healthcare Power of [place];Living will -  Does patient want to make changes to medical advance directive? - - No - Patient declined - -  Copy of [place] in Chart? No - copy requested - - No - copy requested -    Tobacco Social History   Tobacco Use  Smoking Status Former Smoker  . Quit date: 06/18/1973  . Years since quitting: 45.8  Smokeless Tobacco Never Used     Counseling given: Not Answered   Clinical Intake:  Pre-visit preparation completed: Yes  Pain : 0-10 Pain Score: 4  Pain Type: Acute pain Pain Location: Knee Pain Orientation: Right, Left Pain Descriptors / Indicators: Aching Pain Frequency: Constant     Nutritional Risks: None Diabetes: No  How often do you need to have someone help you when you read instructions, pamphlets, or other written materials from your doctor or pharmacy?: 1 - Never  Interpreter Needed?: No   Information entered by :: [facility], LPN  Past Medical History:  Diagnosis Date  . Spermatocele    Past Surgical History:  Procedure Laterality Date  . COLONOSCOPY W/ POLYPECTOMY  06/24/13  . HERNIA REPAIR    . SPERMATOCELECTOMY Right 05/24/2017   Procedure: SPERMATOCELECTOMY;  Surgeon: Abbie Sons, MD;  Location: [facility] ORS;  Service: Urology;  Laterality: Right;  . TONSILLECTOMY     Family History  Problem Relation Age of Onset  . COPD Mother   . Heart attack Father   . Diabetes Father   . Aneurysm Brother   . Prostate cancer Neg Hx   . Kidney cancer Neg Hx   . Bladder Cancer Neg Hx    Social History   Socioeconomic History  . Marital status: Married    Spouse name: Not on file  . Number of children: 2  . Years of education: Not on file  . Highest education level: Some college, no degree  Occupational History  . Occupation: Self Employed    Comment: Farming  . Occupation: retired  Scientific laboratory technician  . Financial resource strain: Not hard at all  . Food insecurity    Worry: Never true    Inability: Never true  . Transportation needs    Medical: No    Non-medical: No  Tobacco Use  . Smoking status: Former Smoker    Quit date: 06/18/1973    Years since quitting: 45.8  . Smokeless tobacco: Never Used  Substance and Sexual Activity  . Alcohol use: No    Alcohol/week:  0.0 standard drinks  . Drug use: No  . Sexual activity: Not on file  Lifestyle  . Physical activity    Days per week: 0 days    Minutes per session: 0 min  . Stress: Not at all  Relationships  . Social Herbalist on phone: Patient refused    Gets together: Patient refused    Attends religious service: Patient refused    Active member of club or organization: Patient refused    Attends meetings of clubs or organizations: Patient refused    Relationship status: Patient refused  Other Topics Concern  . Not on file  Social History Narrative  . Not on file    Outpatient Encounter  Medications as of 04/28/2019  Medication Sig  . tadalafil (ADCIRCA/CIALIS) 20 MG tablet Take 1 tablet (20 mg total) by mouth daily as needed for erectile dysfunction.  . hyoscyamine (LEVSIN SL) 0.125 MG SL tablet Place 1 tablet (0.125 mg total) under the tongue every 4 (four) hours as needed. (Patient not taking: Reported on 04/28/2019)  . metroNIDAZOLE (METROGEL) 1 % gel Apply topically daily. (Patient not taking: Reported on 07/14/2018)   No facility-administered encounter medications on file as of 04/28/2019.     Activities of Daily Living In your present state of health, do you have any difficulty performing the following activities: 04/28/2019  Hearing? N  Vision? N  Difficulty concentrating or making decisions? N  Walking or climbing stairs? N  Dressing or bathing? N  Doing errands, shopping? N  Preparing Food and eating ? N  Using the Toilet? N  In the past six months, have you accidently leaked urine? N  Do you have problems with loss of bowel control? N  Managing your Medications? N  Managing your Finances? N  Housekeeping or managing your Housekeeping? N  Some recent data might be hidden    Patient Care Team: Birdie Sons, MD as PCP - General (Family Medicine) Anell Barr, OD (Optometry) Bernardo Heater, Ronda Fairly, MD (Urology)   Assessment:   This is a routine wellness examination for Anthony Reynolds.  Exercise Activities and Dietary recommendations Current Exercise Habits: The patient does not participate in regular exercise at present, Exercise limited by: orthopedic condition(s)  Goals    . DIET - REDUCE PORTION SIZE     Recommend to continue current diet plan of eating smaller portions and staying away from junk food or bread.        Fall Risk: Fall Risk  04/28/2019 04/23/2018 04/17/2017 04/16/2016  Falls in the past year? 0 0 No No  Number falls in past yr: 0 - - -  Injury with Fall? 0 - - -    FALL RISK PREVENTION PERTAINING TO THE HOME:  Any stairs in or  around the home? Yes  If so, are there any without handrails? No   Home free of loose throw rugs in walkways, pet beds, electrical cords, etc? Yes  Adequate lighting in your home to reduce risk of falls? Yes   ASSISTIVE DEVICES UTILIZED TO PREVENT FALLS:  Life alert? No  Use of a cane, walker or w/c? No  Grab bars in the bathroom? No  Shower chair or bench in shower? No  Elevated toilet seat or a handicapped toilet? No   TIMED UP AND GO:  Was the test performed? No .    Depression Screen PHQ 2/9 Scores 04/28/2019 04/23/2018 04/23/2018 04/17/2017  PHQ - 2 Score 0 0 0 0  PHQ-  9 Score - 0 - 0    Cognitive Function: Declined today.         Immunization History  Administered Date(s) Administered  . Hepatitis A, Adult 04/09/2014, 04/14/2015  . Influenza Split 04/04/2012  . Influenza, High Dose Seasonal PF 04/17/2017, 04/23/2018  . Influenza,inj,Quad PF,6+ Mos 04/08/2013, 04/09/2014, 04/14/2015, 04/16/2016  . Pneumococcal Conjugate-13 04/17/2017  . Pneumococcal Polysaccharide-23 04/23/2018  . Td 04/23/2018  . Tdap 07/08/2007  . Zoster 04/04/2012    Qualifies for Shingles Vaccine? Yes  Zostavax completed 04/04/12. Due for Shingrix. Advised may also receive vaccine at local pharmacy or Health Dept. Verbalized acceptance and understanding.  Tdap: Up to date  Flu Vaccine: Up to date  Pneumococcal Vaccine: Completed series  Screening Tests Health Maintenance  Topic Date Due  . INFLUENZA VACCINE  01/17/2019  . COLONOSCOPY  06/26/2021  . TETANUS/TDAP  04/23/2028  . Hepatitis C Screening  Completed  . PNA vac Low Risk Adult  Completed   Cancer Screenings:  Colorectal Screening: Completed 06/26/18. Repeat every 3 years.  Lung Cancer Screening: (Low Dose CT Chest recommended if Age 6-80 years, 30 pack-year currently smoking OR have quit w/in 15years.) does not qualify.   Additional Screening:  Hepatitis C Screening: Up to date  Vision Screening: Recommended annual  ophthalmology exams for early detection of glaucoma and other disorders of the eye.  Dental Screening: Recommended annual dental exams for proper oral hygiene  Community Resource Referral:  CRR required this visit?  No        Plan:  I have personally reviewed and addressed the Medicare Annual Wellness questionnaire and have noted the following in the patient's chart:  A. Medical and social history B. Use of alcohol, tobacco or illicit drugs  C. Current medications and supplements D. Functional ability and status E.  Nutritional status F.  Physical activity G. Advance directives H. List of other physicians I.  Hospitalizations, surgeries, and ER visits in previous 12 months J.  [place] such as hearing and vision if needed, cognitive and depression L. Referrals and appointments   In addition, I have reviewed and discussed with patient certain preventive protocols, quality metrics, and best practice recommendations. A written personalized care plan for preventive services as well as general preventive health recommendations were provided to patient.   Glendora Score, [place]  [phone] Nurse Health Advisor   Nurse Notes: Pt to receive a flu shot at upcoming apt.

## 2019-04-27 NOTE — Telephone Encounter (Signed)
Dr, Caryn Section when I try to sign off on the labs it says that the PSA is not covered under the dx code of screening for prostate cancer.The system ask me to have patient fill out a waiver or change dx code.

## 2019-04-28 ENCOUNTER — Ambulatory Visit (INDEPENDENT_AMBULATORY_CARE_PROVIDER_SITE_OTHER): Payer: Medicare Other

## 2019-04-28 ENCOUNTER — Ambulatory Visit (INDEPENDENT_AMBULATORY_CARE_PROVIDER_SITE_OTHER): Payer: Medicare Other | Admitting: Family Medicine

## 2019-04-28 ENCOUNTER — Encounter: Payer: Self-pay | Admitting: Family Medicine

## 2019-04-28 ENCOUNTER — Other Ambulatory Visit: Payer: Self-pay

## 2019-04-28 VITALS — BP 142/98 | HR 58 | Temp 97.1°F | Resp 16 | Ht 69.0 in | Wt 199.0 lb

## 2019-04-28 DIAGNOSIS — Z23 Encounter for immunization: Secondary | ICD-10-CM

## 2019-04-28 DIAGNOSIS — Z0000 Encounter for general adult medical examination without abnormal findings: Secondary | ICD-10-CM

## 2019-04-28 DIAGNOSIS — I10 Essential (primary) hypertension: Secondary | ICD-10-CM | POA: Diagnosis not present

## 2019-04-28 DIAGNOSIS — Z125 Encounter for screening for malignant neoplasm of prostate: Secondary | ICD-10-CM | POA: Diagnosis not present

## 2019-04-28 DIAGNOSIS — Z131 Encounter for screening for diabetes mellitus: Secondary | ICD-10-CM | POA: Diagnosis not present

## 2019-04-28 DIAGNOSIS — M170 Bilateral primary osteoarthritis of knee: Secondary | ICD-10-CM

## 2019-04-28 MED ORDER — MELOXICAM 7.5 MG PO TABS: 7.5000 mg | ORAL_TABLET | Freq: Every day | ORAL | 0 refills | Status: DC

## 2019-04-28 MED ORDER — AMLODIPINE BESYLATE 2.5 MG PO TABS: 2.5000 mg | ORAL_TABLET | Freq: Every day | ORAL | 2 refills | Status: DC

## 2019-04-28 NOTE — Patient Instructions (Addendum)
[name] , Thank you for taking time to come for your Medicare Wellness Visit. I appreciate your ongoing commitment to your health goals. Please review the following plan we discussed and let me know if I can assist you in the future.   Screening recommendations/referrals: Colonoscopy: Up to date, due 06/2021 Recommended yearly ophthalmology/optometry visit for glaucoma screening and checkup Recommended yearly dental visit for hygiene and checkup  Vaccinations: Influenza vaccine: Currently due, pt to receive at next in office visit.  Pneumococcal vaccine: Completed series Tdap vaccine: Up to date, due 04/2028 Shingles vaccine: Pt declines today.     Advanced directives: Please bring a copy of your POA (Power of Attorney) and/or Living Will to your next appointment.   Conditions/risks identified: Recommemd to continue to decrease portion sizes and avoid breads.   Next appointment: 3:00 PM today with Dr Caryn Section.  Preventive Care 67 Years and Older, Male Preventive care refers to lifestyle choices and visits with your health care provider that can promote health and wellness. What does preventive care include?  A yearly physical exam. This is also called an annual well check.  Dental exams once or twice a year.  Routine eye exams. Ask your health care provider how often you should have your eyes checked.  Personal lifestyle choices, including:  Daily care of your teeth and gums.  Regular physical activity.  Eating a healthy diet.  Avoiding tobacco and drug use.  Limiting alcohol use.  Practicing safe sex.  Taking low doses of aspirin every day.  Taking vitamin and mineral supplements as recommended by your health care provider. What happens during an annual well check? The services and screenings done by your health care provider during your annual well check will depend on your age, overall health, lifestyle risk factors, and family history of disease. Counseling  Your  health care provider may ask you questions about your:  Alcohol use.  Tobacco use.  Drug use.  Emotional well-being.  Home and relationship well-being.  Sexual activity.  Eating habits.  History of falls.  Memory and ability to understand (cognition).  Work and work Statistician. Screening  You may have the following tests or measurements:  Height, weight, and BMI.  Blood pressure.  Lipid and cholesterol levels. These may be checked every 5 years, or more frequently if you are over 33 years old.  Skin check.  Lung cancer screening. You may have this screening every year starting at age 40 if you have a 30-pack-year history of smoking and currently smoke or have quit within the past 15 years.  Fecal occult blood test (FOBT) of the stool. You may have this test every year starting at age 48.  Flexible sigmoidoscopy or colonoscopy. You may have a sigmoidoscopy every 5 years or a colonoscopy every 10 years starting at age 77.  Prostate cancer screening. Recommendations will vary depending on your family history and other risks.  Hepatitis C blood test.  Hepatitis B blood test.  Sexually transmitted disease (STD) testing.  Diabetes screening. This is done by checking your blood sugar (glucose) after you have not eaten for a while (fasting). You may have this done every 1-3 years.  Abdominal aortic aneurysm (AAA) screening. You may need this if you are a current or former smoker.  Osteoporosis. You may be screened starting at age 68 if you are at high risk. Talk with your health care provider about your test results, treatment options, and if necessary, the need for more tests. Vaccines  Your health care provider may recommend certain vaccines, such as:  Influenza vaccine. This is recommended every year.  Tetanus, diphtheria, and acellular pertussis (Tdap, Td) vaccine. You may need a Td booster every 10 years.  Zoster vaccine. You may need this after age 33.   Pneumococcal 13-valent conjugate (PCV13) vaccine. One dose is recommended after age 23.  Pneumococcal polysaccharide (PPSV23) vaccine. One dose is recommended after age 48. Talk to your health care provider about which screenings and vaccines you need and how often you need them. This information is not intended to replace advice given to you by your health care provider. Make sure you discuss any questions you have with your health care provider. Document Released: 07/01/2015 Document Revised: 02/22/2016 Document Reviewed: 04/05/2015 Elsevier Interactive Patient Education  2017 [employer] Prevention in the Home Falls can cause injuries. They can happen to people of all ages. There are many things you can do to make your home safe and to help prevent falls. What can I do on the outside of my home?  Regularly fix the edges of walkways and driveways and fix any cracks.  Remove anything that might make you trip as you walk through a door, such as a raised step or threshold.  Trim any bushes or trees on the path to your home.  Use bright outdoor lighting.  Clear any walking paths of anything that might make someone trip, such as rocks or tools.  Regularly check to see if handrails are loose or broken. Make sure that both sides of any steps have handrails.  Any raised decks and porches should have guardrails on the edges.  Have any leaves, snow, or ice cleared regularly.  Use sand or salt on walking paths during winter.  Clean up any spills in your garage right away. This includes oil or grease spills. What can I do in the bathroom?  Use night lights.  Install grab bars by the toilet and in the tub and shower. Do not use towel bars as grab bars.  Use non-skid mats or decals in the tub or shower.  If you need to sit down in the shower, use a plastic, non-slip stool.  Keep the floor dry. Clean up any water that spills on the floor as soon as it happens.  Remove soap  buildup in the tub or shower regularly.  Attach bath mats securely with double-sided non-slip rug tape.  Do not have throw rugs and other things on the floor that can make you trip. What can I do in the bedroom?  Use night lights.  Make sure that you have a light by your bed that is easy to reach.  Do not use any sheets or blankets that are too big for your bed. They should not hang down onto the floor.  Have a firm chair that has side arms. You can use this for support while you get dressed.  Do not have throw rugs and other things on the floor that can make you trip. What can I do in the kitchen?  Clean up any spills right away.  Avoid walking on wet floors.  Keep items that you use a lot in easy-to-reach places.  If you need to reach something above you, use a strong step stool that has a grab bar.  Keep electrical cords out of the way.  Do not use floor polish or wax that makes floors slippery. If you must use wax, use non-skid floor wax.  Do  not have throw rugs and other things on the floor that can make you trip. What can I do with my stairs?  Do not leave any items on the stairs.  Make sure that there are handrails on both sides of the stairs and use them. Fix handrails that are broken or loose. Make sure that handrails are as long as the stairways.  Check any carpeting to make sure that it is firmly attached to the stairs. Fix any carpet that is loose or worn.  Avoid having throw rugs at the top or bottom of the stairs. If you do have throw rugs, attach them to the floor with carpet tape.  Make sure that you have a light switch at the top of the stairs and the bottom of the stairs. If you do not have them, ask someone to add them for you. What else can I do to help prevent falls?  Wear shoes that:  Do not have high heels.  Have rubber bottoms.  Are comfortable and fit you well.  Are closed at the toe. Do not wear sandals.  If you use a stepladder:  Make  sure that it is fully opened. Do not climb a closed stepladder.  Make sure that both sides of the stepladder are locked into place.  Ask someone to hold it for you, if possible.  Clearly mark and make sure that you can see:  Any grab bars or handrails.  First and last steps.  Where the edge of each step is.  Use tools that help you move around (mobility aids) if they are needed. These include:  Canes.  Walkers.  Scooters.  Crutches.  Turn on the lights when you go into a dark area. Replace any light bulbs as soon as they burn out.  Set up your furniture so you have a clear path. Avoid moving your furniture around.  If any of your floors are uneven, fix them.  If there are any pets around you, be aware of where they are.  Review your medicines with your doctor. Some medicines can make you feel dizzy. This can increase your chance of falling. Ask your doctor what other things that you can do to help prevent falls. This information is not intended to replace advice given to you by your health care provider. Make sure you discuss any questions you have with your health care provider. Document Released: 03/31/2009 Document Revised: 11/10/2015 Document Reviewed: 07/09/2014 Elsevier Interactive Patient Education  2017 [employer].

## 2019-04-28 NOTE — Patient Instructions (Addendum)
.   Please review the attached list of medications and notify my office if there are any errors.   . Please bring all of your medications to every appointment so we can make sure that our medication list is the same as yours.      Food Choices for Gastroesophageal Reflux Disease, Adult When you have gastroesophageal reflux disease (GERD), the foods you eat and your eating habits are very important. Choosing the right foods can help ease your discomfort. Think about working with a nutrition specialist (dietitian) to help you make good choices. What are tips for following this plan?  Meals Choose healthy foods that are low in fat, such as fruits, vegetables, whole grains, low-fat dairy products, and lean meat, fish, and poultry. Eat small meals often instead of 3 large meals a day. Eat your meals slowly, and in a place where you are relaxed. Avoid bending over or lying down until 2-3 hours after eating. Avoid eating meals 2-3 hours before bed. Avoid drinking a lot of liquid with meals. Cook foods using methods other than frying. Bake, grill, or broil food instead. Avoid or limit: Chocolate. Peppermint or spearmint. Alcohol. Pepper. Black and decaffeinated coffee. Black and decaffeinated tea. Bubbly (carbonated) soft drinks. Caffeinated energy drinks and soft drinks. Limit high-fat foods such as: Fatty meat or fried foods. Whole milk, cream, butter, or ice cream. Nuts and nut butters. Pastries, donuts, and sweets made with butter or shortening. Avoid foods that cause symptoms. These foods may be different for everyone. Common foods that cause symptoms include: Tomatoes. Oranges, lemons, and limes. Peppers. Spicy food. Onions and garlic. Vinegar. Lifestyle Maintain a healthy weight. Ask your doctor what weight is healthy for you. If you need to lose weight, work with your doctor to do so safely. Exercise for at least 30 minutes for 5 or more days each week, or as told by your  doctor. Wear loose-fitting clothes. Do not smoke. If you need help quitting, ask your doctor. Sleep with the head of your bed higher than your feet. Use a wedge under the mattress or blocks under the bed frame to raise the head of the bed. Summary When you have gastroesophageal reflux disease (GERD), food and lifestyle choices are very important in easing your symptoms. Eat small meals often instead of 3 large meals a day. Eat your meals slowly, and in a place where you are relaxed. Limit high-fat foods such as fatty meat or fried foods. Avoid bending over or lying down until 2-3 hours after eating. Avoid peppermint and spearmint, caffeine, alcohol, and chocolate. This information is not intended to replace advice given to you by your health care provider. Make sure you discuss any questions you have with your health care provider. Document Released: 12/04/2011 Document Revised: 09/25/2018 Document Reviewed: 07/10/2016 Elsevier Patient Education  2020 [employer]. .

## 2019-04-28 NOTE — Progress Notes (Signed)
Patient: Anthony Reynolds Male    DOB: [birth year]   68 y.o.   MRN: [id number] Visit Date: 04/28/2019  Today's Provider: Lelon Huh, MD   Chief Complaint  Patient presents with  . Follow-up   Subjective:     HPI  Follow up for Rosacea:  The patient was last seen for this 1 years ago. Changes made at last visit include trying Metronidazole gel.  He reports fair compliance with treatment. Patient feel that this medication did not help with symptoms. He feels that condition is Unchanged. He is not having side effects.   ------------------------------------------------------------------------------------  Follow up for Neuropathy:  The patient was last seen for this 1 years ago. Changes made at last visit include advising patient to take OTC Vitamin B complex and OTC Vitamin D3 1,000 units daily.  He reports poor compliance with treatment. Patient did not start taking OTC supplements.  He feels that condition is Unchanged. He is not having side effects.   ------------------------------------------------------------------------------------ He also reports he has been having high blood pressures usually in the 130s to 140s at home, and especially high when he goes to the dentist.  Wt Readings from Last 3 Encounters:  04/28/19 199 lb (90.3 kg)  07/14/18 190 lb (86.2 kg)  04/23/18 189 lb 6.4 oz (85.9 kg)   He has also been having pain in his knees, right worse than left for several months making it more difficult to talk and stay active.   No Known Allergies   Current Outpatient Medications:  .  tadalafil (ADCIRCA/CIALIS) 20 MG tablet, Take 1 tablet (20 mg total) by mouth daily as needed for erectile dysfunction., Disp: 10 tablet, Rfl: 6 .  metroNIDAZOLE (METROGEL) 1 % gel, Apply topically daily. (Patient not taking: Reported on 07/14/2018), Disp: 45 g, Rfl: 2  Review of Systems  Constitutional: Negative for appetite change, chills and fever.  Respiratory:  Negative for chest tightness, shortness of breath and wheezing.   Cardiovascular: Negative for chest pain and palpitations.  Gastrointestinal: Negative for abdominal pain, nausea and vomiting.  Musculoskeletal: Positive for arthralgias.    Social History   Tobacco Use  . Smoking status: Former Smoker    Quit date: 06/18/1973    Years since quitting: 45.8  . Smokeless tobacco: Never Used  Substance Use Topics  . Alcohol use: No    Alcohol/week: 0.0 standard drinks      Objective:   BP (!) 142/98 (BP Location: Right Arm, Cuff Size: Large)   Pulse (!) 58   Temp (!) 97.1 F (36.2 C) (Temporal)   Resp 16   Ht 5\' 9"  (1.753 m)   Wt 199 lb (90.3 kg)   SpO2 98% Comment: room air  BMI 29.39 kg/m  Vitals:   04/28/19 1446 04/28/19 1450  BP: 138/90 (!) 142/98  Pulse: (!) 58   Resp: 16   Temp: (!) 97.1 F (36.2 C)   TempSrc: Temporal   SpO2: 98%   Weight: 199 lb (90.3 kg)   Height: 5\' 9"  (1.753 m)   Body mass index is 29.39 kg/m.   Physical Exam   General Appearance:    Well developed, well nourished male. Alert, cooperative, in no acute distress, appears stated age  Head:    Normocephalic, without obvious abnormality, atraumatic  Lungs:     Clear to auscultation bilaterally, respirations unlabored  Chest wall:    No tenderness or deformity  Heart:    Bradycardic. Normal rhythm. No murmurs, rubs,  or gallops.  S1 and S2 normal  Abdomen:     Soft, non-tender, bowel sounds active all four quadrants,    no masses, no organomegaly  Genitalia:    deferred  Rectal:    deferred  Extremities:   All extremities are intact. No cyanosis or edema. Tender joint line of medial and lateral aspect both knees. No effusion. No erythema.   Pulses:   2+ and symmetric all extremities  Skin:   Skin color, texture, turgor normal, no rashes or lesions        Assessment & Plan    1. Essential hypertension Recent onset. Counseled on healthy diet, work on losing weight. Start amlodipine - EKG  12-Lead - amLODipine (NORVASC) 2.5 MG tablet; Take 1 tablet (2.5 mg total) by mouth daily.  Dispense: 30 tablet; Refill: 2 Check bp in about 6 weeks.   2. Primary osteoarthritis of both knees start- meloxicam (MOBIC) 7.5 MG tablet; Take 1 tablet (7.5 mg total) by mouth daily.  Dispense: 30 tablet; Refill: 0  3. Need for influenza vaccination  - Flu Vaccine QUAD High Dose(Fluad)  The entirety of the information documented in the History of Present Illness, Review of Systems and Physical Exam were personally obtained by me. Portions of this information were initially documented by Meyer Cory, CMA and reviewed by me for thoroughness and accuracy.      Lelon Huh, MD  [facility]

## 2019-04-29 LAB — PSA: Prostate Specific Ag, Serum: 1.1 ng/mL (ref 0.0–4.0)

## 2019-04-29 LAB — GLUCOSE, RANDOM: Glucose: 87 mg/dL (ref 65–99)

## 2019-05-11 ENCOUNTER — Other Ambulatory Visit: Payer: Self-pay

## 2019-05-12 ENCOUNTER — Other Ambulatory Visit: Payer: Self-pay | Admitting: Family Medicine

## 2019-05-12 DIAGNOSIS — M170 Bilateral primary osteoarthritis of knee: Secondary | ICD-10-CM

## 2019-05-12 MED ORDER — MELOXICAM 7.5 MG PO TABS: 7.5000 mg | ORAL_TABLET | Freq: Every day | ORAL | 5 refills | Status: DC | PRN

## 2019-05-12 NOTE — Telephone Encounter (Signed)
Pt called and stated that he would like to know if he will need to continue to take meloxicam (MOBIC) 7.5 MG tablet [id number] pt states that he does not have refills and will not have enough until next appointment. Please advise.

## 2019-05-12 NOTE — Telephone Encounter (Signed)
Is it helping with his knees. If so will send in a refill. It's best to just take it as needed, although he could take it every day if he really needs to.

## 2019-05-12 NOTE — Telephone Encounter (Signed)
Patient states the Meloxicam is helping with the knee pain, but he has not been quite as active as normal. Patient was not aware medication could be used PRN.

## 2019-05-13 ENCOUNTER — Other Ambulatory Visit: Payer: Self-pay | Admitting: Urology

## 2019-05-13 DIAGNOSIS — N5201 Erectile dysfunction due to arterial insufficiency: Secondary | ICD-10-CM

## 2019-06-09 NOTE — Progress Notes (Signed)
Patient: Anthony Reynolds Male    DOB: [birth year]   68 y.o.   MRN: [id number] Visit Date: 06/10/2019  Today's Provider: Lelon Huh, MD   Chief Complaint  Patient presents with  . Hypertension   Subjective:     HPI  Hypertension, follow-up:  BP Readings from Last 3 Encounters:  06/10/19 (!) 150/92  04/28/19 (!) 142/98  07/14/18 (!) 150/94    He was last seen for hypertension 6 weeks ago.  BP at that visit was 142/98. Management since that visit includes starting Amlodipine 2.5mg  daily. Patient was also advised to work on healthy diet and weight loss. He reports good compliance with treatment. He is not having side effects.  He is exercising. He is adherent to low salt diet.   Outside blood pressures are 120-140/ 80-90. He is experiencing none.  Patient denies chest pain, chest pressure/discomfort, claudication, dyspnea, exertional chest pressure/discomfort, fatigue, irregular heart beat, lower extremity edema, near-syncope, orthopnea, palpitations, paroxysmal nocturnal dyspnea, syncope and tachypnea.   Cardiovascular risk factors include advanced age (older than 64 for men, 67 for women), hypertension and male gender.  Use of agents associated with hypertension: none.     Weight trend: stable Wt Readings from Last 3 Encounters:  06/10/19 199 lb (90.3 kg)  04/28/19 199 lb (90.3 kg)  07/14/18 190 lb (86.2 kg)    Current diet: in general, an "unhealthy" diet  ------------------------------------------------------------------------  No Known Allergies   Current Outpatient Medications:  .  amLODipine (NORVASC) 2.5 MG tablet, Take 1 tablet (2.5 mg total) by mouth daily., Disp: 30 tablet, Rfl: 2 .  meloxicam (MOBIC) 7.5 MG tablet, Take 1 tablet (7.5 mg total) by mouth daily as needed for pain., Disp: 30 tablet, Rfl: 5 .  tadalafil (ADCIRCA/CIALIS) 20 MG tablet, Take 1 tablet (20 mg total) by mouth daily as needed for erectile dysfunction., Disp: 10 tablet,  Rfl: 6  Review of Systems  Constitutional: Negative for appetite change, chills and fever.  Respiratory: Negative for chest tightness, shortness of breath and wheezing.   Cardiovascular: Negative for chest pain and palpitations.  Gastrointestinal: Negative for abdominal pain, nausea and vomiting.    Social History   Tobacco Use  . Smoking status: Former Smoker    Quit date: 06/18/1973    Years since quitting: 46.0  . Smokeless tobacco: Never Used  Substance Use Topics  . Alcohol use: No    Alcohol/week: 0.0 standard drinks      Objective:   BP (!) 150/92 (BP Location: Right Arm, Cuff Size: Large)   Pulse 64   Temp (!) 96.9 F (36.1 C) (Temporal)   Resp 16   Wt 199 lb (90.3 kg)   SpO2 98% Comment: room air  BMI 29.39 kg/m  Vitals:   06/10/19 0808 06/10/19 0812  BP: 140/90 (!) 150/92  Pulse: 64   Resp: 16   Temp: (!) 96.9 F (36.1 C)   TempSrc: Temporal   SpO2: 98%   Weight: 199 lb (90.3 kg)   Body mass index is 29.39 kg/m.   Physical Exam   General Appearance:    Well developed, well nourished male in no acute distress  Eyes:    PERRL, conjunctiva/corneas clear, EOM's intact       Lungs:     Clear to auscultation bilaterally, respirations unlabored  Heart:    Normal heart rate. Normal rhythm. No murmurs, rubs, or gallops.   MS:   All extremities are intact.   Neurologic:  Awake, alert, oriented x 3. No apparent focal neurological           defect.            Assessment & Plan    1. Essential hypertension Tolerating initiation of amlodipine, but BP still mildly elevated, increase to - amLODipine (NORVASC) 5 MG tablet; Take 1 tablet (5 mg total) by mouth daily.  Dispense: 30 tablet; Refill: 3  Return for bp check in 3-4 months    Lelon Huh, MD  [facility]

## 2019-06-10 ENCOUNTER — Other Ambulatory Visit: Payer: Self-pay

## 2019-06-10 ENCOUNTER — Ambulatory Visit (INDEPENDENT_AMBULATORY_CARE_PROVIDER_SITE_OTHER): Payer: Medicare Other | Admitting: Family Medicine

## 2019-06-10 ENCOUNTER — Encounter: Payer: Self-pay | Admitting: Family Medicine

## 2019-06-10 VITALS — BP 150/92 | HR 64 | Temp 96.9°F | Resp 16 | Wt 199.0 lb

## 2019-06-10 DIAGNOSIS — I10 Essential (primary) hypertension: Secondary | ICD-10-CM | POA: Insufficient documentation

## 2019-06-10 MED ORDER — AMLODIPINE BESYLATE 5 MG PO TABS: 5.0000 mg | ORAL_TABLET | Freq: Every day | ORAL | 3 refills | Status: DC

## 2019-06-10 NOTE — Patient Instructions (Signed)
.   Please review the attached list of medications and notify my office if there are any errors.   . Please bring all of your medications to every appointment so we can make sure that our medication list is the same as yours.   

## 2019-09-11 ENCOUNTER — Other Ambulatory Visit: Payer: Self-pay | Admitting: Family Medicine

## 2019-09-11 ENCOUNTER — Other Ambulatory Visit: Payer: Self-pay | Admitting: Urology

## 2019-09-11 DIAGNOSIS — N5201 Erectile dysfunction due to arterial insufficiency: Secondary | ICD-10-CM

## 2019-09-11 MED ORDER — TADALAFIL 20 MG PO TABS: 20.0000 mg | ORAL_TABLET | Freq: Every day | ORAL | 6 refills | Status: DC | PRN

## 2019-10-05 NOTE — Progress Notes (Signed)
Established patient visit    Patient: Anthony Reynolds   DOB: [birth year]   69 y.o. Male  MRN: [id number] Visit Date: 10/07/2019  Today's healthcare provider: Lelon Huh, MD   Chief Complaint  Patient presents with  . Hypertension   Subjective    HPI Hypertension, follow-up  BP Readings from Last 3 Encounters:  10/07/19 134/80  06/10/19 (!) 150/92  04/28/19 (!) 142/98   Wt Readings from Last 3 Encounters:  10/07/19 200 lb (90.7 kg)  06/10/19 199 lb (90.3 kg)  04/28/19 199 lb (90.3 kg)     He was last seen for hypertension 4 months ago.  BP at that visit was 150/92. Management since that visit includes increasing Amlodipine 5mg  daily. He reports good compliance with treatment. He is not having side effects.  He is following a Regular diet. He is exercising. He does not smoke.  Use of agents associated with hypertension: none.    Symptoms:  YES NO    []    [x]    Chest Pain   []    [x]    Chest pressure/discomfort   []    [x]    Palpitations   []    [x]    Dyspnea   []    [x]    Orthopnea   []    [x]    Paroxysmal nocturnal dyspnea   []    [x]    Lower extremity edema   []    [x]   Syncope     Last basic metabolic panel Lab Results  Component Value Date   GLUCOSE 87 04/28/2019   NA 140 07/14/2018   K 4.2 07/14/2018   CL 101 07/14/2018   CO2 23 07/14/2018   BUN 15 07/14/2018   CREATININE 1.04 07/14/2018   GFRNONAA 74 07/14/2018   GFRAA 86 07/14/2018   CALCIUM 9.4 07/14/2018   Last lipids Lab Results  Component Value Date   CHOL 181 04/17/2017   HDL 54 04/17/2017   LDLCALC 113 (H) 04/17/2017   TRIG 57 04/17/2017   CHOLHDL 3.4 04/17/2017   The 10-year ASCVD risk score Mikey Bussing DC Jr., et al., 2013) is: 17.9%   ---------------------------------------------------------------------------------------------------     Medications: Outpatient Medications Prior to Visit  Medication Sig  . amLODipine (NORVASC) 5 MG tablet Take 1 tablet (5 mg total) by mouth  daily.  . meloxicam (MOBIC) 7.5 MG tablet Take 1 tablet (7.5 mg total) by mouth daily as needed for pain.  . tadalafil (CIALIS) 20 MG tablet Take 1 tablet (20 mg total) by mouth daily as needed for erectile dysfunction.   No facility-administered medications prior to visit.    Review of Systems  Constitutional: Negative.   Respiratory: Negative.   Cardiovascular: Negative.   Neurological: Negative.        Objective    BP 134/80 (BP Location: Left Arm, Patient Position: Sitting, Cuff Size: Large)   Pulse 70   Temp (!) 97.5 F (36.4 C) (Temporal)   Resp 16   Wt 200 lb (90.7 kg)   SpO2 96% Comment: room air  BMI 29.53 kg/m    Physical Exam  General appearance:  Overweight male, cooperative and in no acute distress Head: Normocephalic, without obvious abnormality, atraumatic Respiratory: Respirations even and unlabored, normal respiratory rate Extremities: All extremities are intact.  Skin: Skin color, texture, turgor normal. No rashes seen  Psych: Appropriate mood and affect. Neurologic: Mental status: Alert, oriented to person, place, and time, thought content appropriate.   No results found for any visits on 10/07/19.   Assessment &  Plan    1. Essential hypertension Much better with increased dose of amlodipine. Continue current medications.  Follow up for annual CPE in November - amLODipine (NORVASC) 5 MG tablet; Take 1 tablet (5 mg total) by mouth daily.  Dispense: 90 tablet; Refill: 3   No follow-ups on file.      The entirety of the information documented in the History of Present Illness, Review of Systems and Physical Exam were personally obtained by me. Portions of this information were initially documented by the CMA and reviewed by me for thoroughness and accuracy.      Lelon Huh, MD  [facility] [phone] (phone) [phone] (fax)  [place]

## 2019-10-07 ENCOUNTER — Other Ambulatory Visit: Payer: Self-pay

## 2019-10-07 ENCOUNTER — Ambulatory Visit (INDEPENDENT_AMBULATORY_CARE_PROVIDER_SITE_OTHER): Payer: Medicare Other | Admitting: Family Medicine

## 2019-10-07 ENCOUNTER — Encounter: Payer: Self-pay | Admitting: Family Medicine

## 2019-10-07 DIAGNOSIS — I10 Essential (primary) hypertension: Secondary | ICD-10-CM

## 2019-10-07 MED ORDER — AMLODIPINE BESYLATE 5 MG PO TABS: 5.0000 mg | ORAL_TABLET | Freq: Every day | ORAL | 3 refills | Status: DC

## 2020-01-17 HISTORY — PX: SKIN GRAFT SPLIT THICKNESS LEG / FOOT: SUR1303

## 2020-02-05 ENCOUNTER — Encounter: Payer: Self-pay | Admitting: Emergency Medicine

## 2020-02-05 ENCOUNTER — Other Ambulatory Visit: Payer: Self-pay

## 2020-02-05 ENCOUNTER — Ambulatory Visit: Payer: Self-pay | Admitting: *Deleted

## 2020-02-05 ENCOUNTER — Emergency Department
Admission: EM | Admit: 2020-02-05 | Discharge: 2020-02-05 | Disposition: A | Discharge status: Home / Self Care | Payer: Medicare Other | Attending: Emergency Medicine | Admitting: Emergency Medicine

## 2020-02-05 DIAGNOSIS — Z79899 Other long term (current) drug therapy: Secondary | ICD-10-CM | POA: Diagnosis not present

## 2020-02-05 DIAGNOSIS — T22211A Burn of second degree of right forearm, initial encounter: Secondary | ICD-10-CM | POA: Diagnosis not present

## 2020-02-05 DIAGNOSIS — T2220XA Burn of second degree of shoulder and upper limb, except wrist and hand, unspecified site, initial encounter: Secondary | ICD-10-CM

## 2020-02-05 DIAGNOSIS — T24091A Burn of unspecified degree of multiple sites of right lower limb, except ankle and foot, initial encounter: Secondary | ICD-10-CM | POA: Diagnosis present

## 2020-02-05 DIAGNOSIS — Y999 Unspecified external cause status: Secondary | ICD-10-CM | POA: Diagnosis not present

## 2020-02-05 DIAGNOSIS — T22232A Burn of second degree of left upper arm, initial encounter: Secondary | ICD-10-CM | POA: Diagnosis not present

## 2020-02-05 DIAGNOSIS — T24232A Burn of second degree of left lower leg, initial encounter: Secondary | ICD-10-CM | POA: Diagnosis not present

## 2020-02-05 DIAGNOSIS — T24231A Burn of second degree of right lower leg, initial encounter: Secondary | ICD-10-CM | POA: Diagnosis not present

## 2020-02-05 DIAGNOSIS — I10 Essential (primary) hypertension: Secondary | ICD-10-CM | POA: Diagnosis not present

## 2020-02-05 DIAGNOSIS — X04XXXA Exposure to ignition of highly flammable material, initial encounter: Secondary | ICD-10-CM | POA: Insufficient documentation

## 2020-02-05 DIAGNOSIS — Y9389 Activity, other specified: Secondary | ICD-10-CM | POA: Insufficient documentation

## 2020-02-05 DIAGNOSIS — T22231A Burn of second degree of right upper arm, initial encounter: Secondary | ICD-10-CM | POA: Diagnosis not present

## 2020-02-05 DIAGNOSIS — Y929 Unspecified place or not applicable: Secondary | ICD-10-CM | POA: Insufficient documentation

## 2020-02-05 DIAGNOSIS — Z87891 Personal history of nicotine dependence: Secondary | ICD-10-CM | POA: Diagnosis not present

## 2020-02-05 DIAGNOSIS — T22212A Burn of second degree of left forearm, initial encounter: Secondary | ICD-10-CM | POA: Diagnosis not present

## 2020-02-05 DIAGNOSIS — T24209A Burn of second degree of unspecified site of unspecified lower limb, except ankle and foot, initial encounter: Secondary | ICD-10-CM

## 2020-02-05 MED ORDER — SILVER SULFADIAZINE 1 % EX CREA: TOPICAL_CREAM | CUTANEOUS | 1 refills | Status: DC

## 2020-02-05 MED ORDER — HYDROMORPHONE HCL 1 MG/ML IJ SOLN
1.0000 mg | Freq: Once | INTRAMUSCULAR | Status: AC
  Administered 2020-02-05: 1 mg via INTRAMUSCULAR
  Filled 2020-02-05: qty 1

## 2020-02-05 MED ORDER — HYDROCODONE-ACETAMINOPHEN 7.5-325 MG PO TABS
1.0000 | ORAL_TABLET | Freq: Four times a day (QID) | ORAL | 0 refills | Status: AC | PRN
Start: 2020-02-05 — End: 2020-02-10

## 2020-02-05 MED ORDER — SILVER SULFADIAZINE 1 % EX CREA: TOPICAL_CREAM | Freq: Once | CUTANEOUS | Status: AC
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
## 2020-02-05 NOTE — Discharge Instructions (Addendum)
Follow discharge care instruction change dressing daily.  Call the wound care center on Monday morning and tell them you a follow-up from the emergency room.  They will schedule appointment for continued care.
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
## 2020-02-07 DIAGNOSIS — T300 Burn of unspecified body region, unspecified degree: Secondary | ICD-10-CM | POA: Diagnosis not present

## 2020-02-07 DIAGNOSIS — L089 Local infection of the skin and subcutaneous tissue, unspecified: Secondary | ICD-10-CM | POA: Diagnosis not present

## 2020-02-08 ENCOUNTER — Ambulatory Visit (INDEPENDENT_AMBULATORY_CARE_PROVIDER_SITE_OTHER): Payer: Medicare Other | Admitting: Family Medicine

## 2020-02-08 ENCOUNTER — Encounter: Payer: Self-pay | Admitting: Family Medicine

## 2020-02-08 ENCOUNTER — Telehealth: Payer: Self-pay

## 2020-02-08 ENCOUNTER — Other Ambulatory Visit: Payer: Self-pay

## 2020-02-08 VITALS — BP 123/82 | HR 65 | Temp 98.3°F | Resp 16 | Wt 197.4 lb

## 2020-02-08 DIAGNOSIS — T24232D Burn of second degree of left lower leg, subsequent encounter: Secondary | ICD-10-CM | POA: Diagnosis not present

## 2020-02-08 DIAGNOSIS — T2210XD Burn of first degree of shoulder and upper limb, except wrist and hand, unspecified site, subsequent encounter: Secondary | ICD-10-CM | POA: Diagnosis not present

## 2020-02-08 DIAGNOSIS — T24201D Burn of second degree of unspecified site of right lower limb, except ankle and foot, subsequent encounter: Secondary | ICD-10-CM

## 2020-02-08 NOTE — Telephone Encounter (Signed)
So glad they are seeing him. Noted.

## 2020-02-08 NOTE — Patient Instructions (Signed)
Burn Care, Adult A burn is an injury to the skin or the tissues under the skin. There are three types of burns:  First degree. These burns may cause the skin to be red and a bit swollen.  Second degree. These burns are very painful and cause the skin to be very red. The skin may also leak fluid, look shiny, and start to have blisters.  Third degree. These burns cause permanent damage. They turn the skin white or black and make it look charred, dry, and leathery. Taking care of your burn properly can help to prevent pain and infection. It can also help the burn to heal more quickly. How is this treated? Right after a burn:  Rinse or soak the burn under cool water. Do this for several minutes. Do not put ice on your burn. That can cause more damage.  Lightly cover the burn with a clean (sterile) cloth (dressing). Burn care  Raise (elevate) the injured area above the level of your heart while sitting or lying down.  Follow instructions from your doctor about: ? How to clean and take care of the burn. ? When to change and remove the cloth.  Check your burn every day for signs of infection. Check for: ? More redness, swelling, or pain. ? Warmth. ? Pus or a bad smell. Medicine   Take over-the-counter and prescription medicines only as told by your doctor.  If you were prescribed antibiotic medicine, take or apply it as told by your doctor. Do not stop using the antibiotic even if your condition improves. General instructions  To prevent infection: ? Do not put butter, oil, or other home treatments on the burn. ? Do not scratch or pick at the burn. ? Do not break any blisters. ? Do not peel skin.  Do not rub your burn, even when you are cleaning it.  Protect your burn from the sun. Contact a doctor if:  Your condition does not get better.  Your condition gets worse.  You have a fever.  Your burn looks different or starts to have black or red spots on it.  Your burn  feels warm to the touch.  Your pain is not controlled with medicine. Get help right away if:  You have redness, swelling, or pain at the site of the burn.  You have fluid, blood, or pus coming from your burn.  You have red streaks near the burn.  You have very bad pain. This information is not intended to replace advice given to you by your health care provider. Make sure you discuss any questions you have with your health care provider. Document Revised: 09/24/2018 Document Reviewed: 11/22/2015 Elsevier Patient Education  2020 Elsevier Inc.  

## 2020-02-08 NOTE — Telephone Encounter (Signed)
Copied from [place] [id number]. Topic: General - Other >> Feb 08, 2020 10:43 AM Celene Kras wrote: Reason for CRM: Pt called stating that he received his appt in [facility] tomorrow at 8:30am. He states that he just wanted to let Dr. B know. Please advise.
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
## 2020-02-08 NOTE — ED Notes (Signed)
I called the [facility] to see if they can give this patient an earlier appt.  They are unable to see him.  I called [facility] and gave them information.  They will reach out to the patient and get him seen.

## 2020-02-09 DIAGNOSIS — I10 Essential (primary) hypertension: Secondary | ICD-10-CM | POA: Diagnosis present

## 2020-02-09 DIAGNOSIS — R918 Other nonspecific abnormal finding of lung field: Secondary | ICD-10-CM | POA: Diagnosis not present

## 2020-02-09 DIAGNOSIS — T23321A Burn of third degree of single right finger (nail) except thumb, initial encounter: Secondary | ICD-10-CM | POA: Diagnosis not present

## 2020-02-09 DIAGNOSIS — T24399A Burn of third degree of multiple sites of unspecified lower limb, except ankle and foot, initial encounter: Secondary | ICD-10-CM

## 2020-02-09 DIAGNOSIS — Z792 Long term (current) use of antibiotics: Secondary | ICD-10-CM | POA: Diagnosis not present

## 2020-02-09 DIAGNOSIS — T310 Burns involving less than 10% of body surface: Secondary | ICD-10-CM | POA: Diagnosis present

## 2020-02-09 DIAGNOSIS — T23322A Burn of third degree of single left finger (nail) except thumb, initial encounter: Secondary | ICD-10-CM | POA: Diagnosis not present

## 2020-02-09 DIAGNOSIS — T24392A Burn of third degree of multiple sites of left lower limb, except ankle and foot, initial encounter: Secondary | ICD-10-CM | POA: Diagnosis not present

## 2020-02-09 DIAGNOSIS — T24331A Burn of third degree of right lower leg, initial encounter: Secondary | ICD-10-CM | POA: Diagnosis present

## 2020-02-09 DIAGNOSIS — T3110 Burns involving 10-19% of body surface with 0% to 9% third degree burns: Secondary | ICD-10-CM | POA: Diagnosis not present

## 2020-02-09 DIAGNOSIS — N401 Enlarged prostate with lower urinary tract symptoms: Secondary | ICD-10-CM | POA: Diagnosis present

## 2020-02-09 DIAGNOSIS — N138 Other obstructive and reflux uropathy: Secondary | ICD-10-CM | POA: Diagnosis present

## 2020-02-09 DIAGNOSIS — L03115 Cellulitis of right lower limb: Secondary | ICD-10-CM | POA: Diagnosis present

## 2020-02-09 DIAGNOSIS — T24332A Burn of third degree of left lower leg, initial encounter: Secondary | ICD-10-CM | POA: Diagnosis present

## 2020-02-09 DIAGNOSIS — T24391A Burn of third degree of multiple sites of right lower limb, except ankle and foot, initial encounter: Secondary | ICD-10-CM | POA: Diagnosis not present

## 2020-02-09 HISTORY — DX: Burn of third degree of multiple sites of unspecified lower limb, except ankle and foot, initial encounter: T24.399A
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
## 2020-02-25 DIAGNOSIS — T24391A Burn of third degree of multiple sites of right lower limb, except ankle and foot, initial encounter: Secondary | ICD-10-CM | POA: Diagnosis not present

## 2020-02-25 DIAGNOSIS — T24392A Burn of third degree of multiple sites of left lower limb, except ankle and foot, initial encounter: Secondary | ICD-10-CM | POA: Diagnosis not present

## 2020-02-25 DIAGNOSIS — Z09 Encounter for follow-up examination after completed treatment for conditions other than malignant neoplasm: Secondary | ICD-10-CM | POA: Diagnosis not present

## 2020-03-03 DIAGNOSIS — T24392S Burn of third degree of multiple sites of left lower limb, except ankle and foot, sequela: Secondary | ICD-10-CM | POA: Diagnosis not present

## 2020-03-03 DIAGNOSIS — T24391S Burn of third degree of multiple sites of right lower limb, except ankle and foot, sequela: Secondary | ICD-10-CM | POA: Diagnosis not present

## 2020-03-03 DIAGNOSIS — I998 Other disorder of circulatory system: Secondary | ICD-10-CM | POA: Diagnosis not present

## 2020-03-03 DIAGNOSIS — T310 Burns involving less than 10% of body surface: Secondary | ICD-10-CM | POA: Diagnosis not present

## 2020-03-09 DIAGNOSIS — H02889 Meibomian gland dysfunction of unspecified eye, unspecified eyelid: Secondary | ICD-10-CM | POA: Diagnosis not present

## 2020-03-09 DIAGNOSIS — H2513 Age-related nuclear cataract, bilateral: Secondary | ICD-10-CM | POA: Diagnosis not present

## 2020-03-09 DIAGNOSIS — H43811 Vitreous degeneration, right eye: Secondary | ICD-10-CM | POA: Diagnosis not present

## 2020-03-16 DIAGNOSIS — T24392S Burn of third degree of multiple sites of left lower limb, except ankle and foot, sequela: Secondary | ICD-10-CM | POA: Diagnosis not present

## 2020-03-16 DIAGNOSIS — T24391S Burn of third degree of multiple sites of right lower limb, except ankle and foot, sequela: Secondary | ICD-10-CM | POA: Diagnosis not present

## 2020-03-16 DIAGNOSIS — T310 Burns involving less than 10% of body surface: Secondary | ICD-10-CM | POA: Diagnosis not present

## 2020-03-16 DIAGNOSIS — T24332D Burn of third degree of left lower leg, subsequent encounter: Secondary | ICD-10-CM | POA: Diagnosis not present

## 2020-03-16 DIAGNOSIS — I998 Other disorder of circulatory system: Secondary | ICD-10-CM | POA: Diagnosis not present

## 2020-03-16 DIAGNOSIS — T24331D Burn of third degree of right lower leg, subsequent encounter: Secondary | ICD-10-CM | POA: Diagnosis not present

## 2020-04-27 ENCOUNTER — Telehealth: Payer: Self-pay

## 2020-04-27 DIAGNOSIS — Z125 Encounter for screening for malignant neoplasm of prostate: Secondary | ICD-10-CM

## 2020-04-27 DIAGNOSIS — I10 Essential (primary) hypertension: Secondary | ICD-10-CM

## 2020-04-27 DIAGNOSIS — R600 Localized edema: Secondary | ICD-10-CM | POA: Diagnosis not present

## 2020-04-27 DIAGNOSIS — T24391S Burn of third degree of multiple sites of right lower limb, except ankle and foot, sequela: Secondary | ICD-10-CM | POA: Diagnosis not present

## 2020-04-27 DIAGNOSIS — T24392S Burn of third degree of multiple sites of left lower limb, except ankle and foot, sequela: Secondary | ICD-10-CM | POA: Diagnosis not present

## 2020-04-27 DIAGNOSIS — T310 Burns involving less than 10% of body surface: Secondary | ICD-10-CM | POA: Diagnosis not present

## 2020-04-27 NOTE — Telephone Encounter (Signed)
Copied from [place] [id number]. Topic: General - Other >> Apr 27, 2020  2:01 PM Yvette Rack wrote: Reason for CRM: Pt wife requests to have an order for labs to be drawn the morning of 05/03/20 before pt appt. Pt wife requests call back to advise

## 2020-04-28 NOTE — Telephone Encounter (Signed)
Future order placed, does not need to be fastin

## 2020-04-28 NOTE — Telephone Encounter (Signed)
Patients wife advised and verbalized understanding.  

## 2020-05-02 NOTE — Progress Notes (Signed)
Subjective:   Anthony Reynolds is a 69 y.o. male who presents for Medicare Annual/Subsequent preventive examination.  I connected with Renold Genta today by telephone and verified that I am speaking with the correct person using two identifiers. Location patient: home Location provider: work Persons participating in the virtual visit: patient, provider.   I discussed the limitations, risks, security and privacy concerns of performing an evaluation and management service by telephone and the availability of in person appointments. I also discussed with the patient that there may be a patient responsible charge related to this service. The patient expressed understanding and verbally consented to this telephonic visit.    Interactive audio and video telecommunications were attempted between this provider and patient, however failed, due to patient having technical difficulties OR patient did not have access to video capability.  We continued and completed visit with audio only.   Review of Systems    N/A  Cardiac Risk Factors include: advanced age (>81men, >52 women);hypertension;male gender     Objective:    There were no vitals filed for this visit. There is no height or weight on file to calculate BMI.  Advanced Directives 05/03/2020 02/05/2020 04/28/2019 04/23/2018 05/24/2017 05/17/2017 04/17/2017  Does Patient Have a Medical Advance Directive? Yes No Yes Yes Yes Yes Yes  Type of Paramedic of [place];Living will - [place];Living will Living will Healthcare Power of [place];Living will -  Does patient want to make changes to medical advance directive? - - - - No - Patient declined - -  Copy of [place] in Chart? No - copy requested - No - copy requested - - No - copy requested -  Would patient like information on creating a medical advance directive? - No - Patient declined - - - - -     Current Medications (verified) Outpatient Encounter Medications as of 05/03/2020  Medication Sig  . amLODipine (NORVASC) 5 MG tablet Take 1 tablet (5 mg total) by mouth daily.  . tadalafil (CIALIS) 20 MG tablet Take 1 tablet (20 mg total) by mouth daily as needed for erectile dysfunction.  Marland Kitchen doxycycline (DORYX) 100 MG EC tablet Take 100 mg by mouth 2 (two) times daily. (Patient not taking: Reported on 05/03/2020)  . meloxicam (MOBIC) 7.5 MG tablet Take 1 tablet (7.5 mg total) by mouth daily as needed for pain. (Patient not taking: Reported on 05/03/2020)  . silver sulfADIAZINE (SILVADENE) 1 % cream Apply to affected area daily (Patient not taking: Reported on 05/03/2020)   No facility-administered encounter medications on file as of 05/03/2020.    Allergies (verified) Patient has no known allergies.   History: Past Medical History:  Diagnosis Date  . Hypertension   . Spermatocele    Past Surgical History:  Procedure Laterality Date  . COLONOSCOPY W/ POLYPECTOMY  06/24/13  . HERNIA REPAIR    . SKIN GRAFT SPLIT THICKNESS LEG / FOOT    . SPERMATOCELECTOMY Right 05/24/2017   Procedure: SPERMATOCELECTOMY;  Surgeon: Abbie Sons, MD;  Location: [facility] ORS;  Service: Urology;  Laterality: Right;  . TONSILLECTOMY     Family History  Problem Relation Age of Onset  . COPD Mother   . Heart attack Father   . Diabetes Father   . Aneurysm Brother   . Prostate cancer Neg Hx   . Kidney cancer Neg Hx   . Bladder Cancer Neg Hx    Social History   Socioeconomic  History  . Marital status: Married    Spouse name: Not on file  . Number of children: 2  . Years of education: Not on file  . Highest education level: Some college, no degree  Occupational History  . Occupation: Self Employed    Comment: Farming  . Occupation: retired  Tobacco Use  . Smoking status: Former Smoker    Quit date: 06/18/1973    Years since quitting: 46.9  . Smokeless tobacco: Never Used  Vaping Use  .  Vaping Use: Never used  Substance and Sexual Activity  . Alcohol use: No    Alcohol/week: 0.0 standard drinks  . Drug use: No  . Sexual activity: Not on file  Other Topics Concern  . Not on file  Social History Narrative  . Not on file   Social Determinants of Health   Financial Resource Strain: Low Risk   . Difficulty of Paying Living Expenses: Not hard at all  Food Insecurity: No Food Insecurity  . Worried About Charity fundraiser in the Last Year: Never true  . Ran Out of Food in the Last Year: Never true  Transportation Needs: No Transportation Needs  . Lack of Transportation (Medical): No  . Lack of Transportation (Non-Medical): No  Physical Activity: Inactive  . Days of Exercise per Week: 0 days  . Minutes of Exercise per Session: 0 min  Stress: No Stress Concern Present  . Feeling of Stress : Not at all  Social Connections: Moderately Integrated  . Frequency of Communication with Friends and Family: More than three times a week  . Frequency of Social Gatherings with Friends and Family: More than three times a week  . Attends Religious Services: Never  . Active Member of Clubs or Organizations: Yes  . Attends Archivist Meetings: Never  . Marital Status: Married    Tobacco Counseling Counseling given: Not Answered   Clinical Intake:  Pre-visit preparation completed: Yes  Pain : No/denies pain     Nutritional Risks: None Diabetes: No  How often do you need to have someone help you when you read instructions, pamphlets, or other written materials from your doctor or pharmacy?: 1 - Never  Diabetic? No  Interpreter Needed?: No  Information entered by :: [facility], LPN   Activities of Daily Living In your present state of health, do you have any difficulty performing the following activities: 05/03/2020  Hearing? N  Vision? N  Difficulty concentrating or making decisions? N  Walking or climbing stairs? N  Dressing or bathing? N  Doing  errands, shopping? N  Preparing Food and eating ? N  Using the Toilet? N  In the past six months, have you accidently leaked urine? N  Do you have problems with loss of bowel control? N  Managing your Medications? N  Managing your Finances? N  Housekeeping or managing your Housekeeping? N  Some recent data might be hidden    Patient Care Team: Birdie Sons, MD as PCP - General (Family Medicine) Anell Barr, OD (Optometry) Bernardo Heater, Ronda Fairly, MD (Urology)  Indicate any recent Medical Services you may have received from other than [facility] providers in the past year (date may be approximate).     Assessment:   This is a routine wellness examination for Mateusz.  Hearing/Vision screen No exam data present  Dietary issues and exercise activities discussed: Current Exercise Habits: The patient does not participate in regular exercise at present, Exercise limited by: None identified  Goals    .  DIET - REDUCE PORTION SIZE     Recommend to continue current diet plan of eating smaller portions and staying away from junk food or bread.       Depression Screen PHQ 2/9 Scores 05/03/2020 04/28/2019 04/23/2018 04/23/2018 04/17/2017 04/16/2016 04/14/2015  PHQ - 2 Score 0 0 0 0 0 0 0  PHQ- 9 Score - - 0 - 0 0 1    Fall Risk Fall Risk  05/03/2020 05/11/2019 04/28/2019 04/23/2018 04/17/2017  Falls in the past year? 0 0 0 0 No  Comment - Emmi Telephone Survey: data to providers prior to load - - -  Number falls in past yr: 0 - 0 - -  Injury with Fall? 0 - 0 - -    Any stairs in or around the home? Yes  If so, are there any without handrails? No  Home free of loose throw rugs in walkways, pet beds, electrical cords, etc? Yes  Adequate lighting in your home to reduce risk of falls? Yes   ASSISTIVE DEVICES UTILIZED TO PREVENT FALLS:  Life alert? No  Use of a cane, walker or w/c? No  Grab bars in the bathroom? No  Shower chair or bench in shower? Yes  Elevated toilet seat or a  handicapped toilet? No    Cognitive Function: Declined today.        Immunizations Immunization History  Administered Date(s) Administered  . Fluad Quad(high Dose 65+) 04/28/2019  . Hepatitis A, Adult 04/09/2014, 04/14/2015  . Influenza Split 04/04/2012  . Influenza, High Dose Seasonal PF 04/17/2017, 04/23/2018  . Influenza,inj,Quad PF,6+ Mos 04/08/2013, 04/09/2014, 04/14/2015, 04/16/2016  . Janssen (J&J) SARS-COV-2 Vaccination 09/23/2019  . Pneumococcal Conjugate-13 04/17/2017  . Pneumococcal Polysaccharide-23 04/23/2018  . Td 04/23/2018  . Tdap 07/08/2007  . Zoster 04/04/2012    TDAP status: Up to date Flu Vaccine status: Declined, Education has been provided regarding the importance of this vaccine but patient still declined. Advised may receive this vaccine at local pharmacy or Health Dept. Aware to provide a copy of the vaccination record if obtained from local pharmacy or Health Dept. Verbalized acceptance and understanding. Pneumococcal vaccine status: Up to date Covid-19 vaccine status: Completed vaccines  Qualifies for Shingles Vaccine? Yes   Zostavax completed Yes   Shingrix Completed?: No.    Education has been provided regarding the importance of this vaccine. Patient has been advised to call insurance company to determine out of pocket expense if they have not yet received this vaccine. Advised may also receive vaccine at local pharmacy or Health Dept. Verbalized acceptance and understanding.  Screening Tests Health Maintenance  Topic Date Due  . INFLUENZA VACCINE  01/17/2020  . COLONOSCOPY  06/26/2021  . TETANUS/TDAP  04/23/2028  . COVID-19 Vaccine  Completed  . Hepatitis C Screening  Completed  . PNA vac Low Risk Adult  Completed    Health Maintenance  Health Maintenance Due  Topic Date Due  . INFLUENZA VACCINE  01/17/2020    Colorectal cancer screening: Completed 06/26/18. Repeat every 3 years  Lung Cancer Screening: (Low Dose CT Chest recommended if  Age 63-80 years, 30 pack-year currently smoking OR have quit w/in 15years.) does not qualify.    Additional Screening:  Hepatitis C Screening: Up to date  Vision Screening: Recommended annual ophthalmology exams for early detection of glaucoma and other disorders of the eye. Is the patient up to date with their annual eye exam?  Yes  Who is the provider or what is the name of  the office in which the patient attends annual eye exams? Dr Ellin Mayhew. If pt is not established with a provider, would they like to be referred to a provider to establish care? No .   Dental Screening: Recommended annual dental exams for proper oral hygiene  Community Resource Referral / Chronic Care Management: CRR required this visit?  No   CCM required this visit?  No      Plan:     I have personally reviewed and noted the following in the patient's chart:   . Medical and social history . Use of alcohol, tobacco or illicit drugs  . Current medications and supplements . Functional ability and status . Nutritional status . Physical activity . Advanced directives . List of other physicians . Hospitalizations, surgeries, and ER visits in previous 12 months . Vitals . Screenings to include cognitive, depression, and falls . Referrals and appointments  In addition, I have reviewed and discussed with patient certain preventive protocols, quality metrics, and best practice recommendations. A written personalized care plan for preventive services as well as general preventive health recommendations were provided to patient.     Deborahann Poteat [place], [place]   16/03/9603   Nurse Notes: Pt would like to receive a flu shot at today's in office apt.

## 2020-05-03 ENCOUNTER — Other Ambulatory Visit: Payer: Self-pay

## 2020-05-03 ENCOUNTER — Encounter: Payer: Self-pay | Admitting: Family Medicine

## 2020-05-03 ENCOUNTER — Ambulatory Visit (INDEPENDENT_AMBULATORY_CARE_PROVIDER_SITE_OTHER): Payer: Medicare Other

## 2020-05-03 ENCOUNTER — Ambulatory Visit (INDEPENDENT_AMBULATORY_CARE_PROVIDER_SITE_OTHER): Payer: Medicare Other | Admitting: Family Medicine

## 2020-05-03 VITALS — BP 138/82 | HR 65 | Temp 98.3°F | Resp 18 | Wt 201.0 lb

## 2020-05-03 DIAGNOSIS — Z125 Encounter for screening for malignant neoplasm of prostate: Secondary | ICD-10-CM | POA: Diagnosis not present

## 2020-05-03 DIAGNOSIS — Z86010 Personal history of colonic polyps: Secondary | ICD-10-CM | POA: Diagnosis not present

## 2020-05-03 DIAGNOSIS — I10 Essential (primary) hypertension: Secondary | ICD-10-CM | POA: Diagnosis not present

## 2020-05-03 DIAGNOSIS — Z0000 Encounter for general adult medical examination without abnormal findings: Secondary | ICD-10-CM | POA: Diagnosis not present

## 2020-05-03 DIAGNOSIS — Z23 Encounter for immunization: Secondary | ICD-10-CM

## 2020-05-03 NOTE — Patient Instructions (Signed)
.   Please review the attached list of medications and notify my office if there are any errors.   . Please bring all of your medications to every appointment so we can make sure that our medication list is the same as yours.    The CDC recommends two doses of Shingrix (the shingles vaccine) separated by 2 to 6 months for adults age 68 years and older. I recommend checking with your pharmacy plan regarding coverage for this vaccine.     

## 2020-05-03 NOTE — Patient Instructions (Signed)
Anthony Reynolds , Thank you for taking time to come for your Medicare Wellness Visit. I appreciate your ongoing commitment to your health goals. Please review the following plan we discussed and let me know if I can assist you in the future.   Screening recommendations/referrals: Colonoscopy: Up to date, due 06/2021 Recommended yearly ophthalmology/optometry visit for glaucoma screening and checkup Recommended yearly dental visit for hygiene and checkup  Vaccinations: Influenza vaccine: Currently due, would like to receive at in office apt today. Pneumococcal vaccine: Completed series Tdap vaccine: Up to date, due 04/2028 Shingles vaccine: Shingrix discussed. Please contact your pharmacy for coverage information.     Advanced directives: Please bring a copy of your POA (Power of Attorney) and/or Living Will to your next appointment.   Conditions/risks identified: Recommend to continue current diet plan of eating smaller portions and staying away from junk food or bread.   Next appointment: 3:00 PM with Dr Caryn Section   Preventive Care 68 Years and Older, Male Preventive care refers to lifestyle choices and visits with your health care provider that can promote health and wellness. What does preventive care include?  A yearly physical exam. This is also called an annual well check.  Dental exams once or twice a year.  Routine eye exams. Ask your health care provider how often you should have your eyes checked.  Personal lifestyle choices, including:  Daily care of your teeth and gums.  Regular physical activity.  Eating a healthy diet.  Avoiding tobacco and drug use.  Limiting alcohol use.  Practicing safe sex.  Taking low doses of aspirin every day.  Taking vitamin and mineral supplements as recommended by your health care provider. What happens during an annual well check? The services and screenings done by your health care provider during your annual well check will depend on  your age, overall health, lifestyle risk factors, and family history of disease. Counseling  Your health care provider may ask you questions about your:  Alcohol use.  Tobacco use.  Drug use.  Emotional well-being.  Home and relationship well-being.  Sexual activity.  Eating habits.  History of falls.  Memory and ability to understand (cognition).  Work and work Statistician. Screening  You may have the following tests or measurements:  Height, weight, and BMI.  Blood pressure.  Lipid and cholesterol levels. These may be checked every 5 years, or more frequently if you are over 68 years old.  Skin check.  Lung cancer screening. You may have this screening every year starting at age 68 if you have a 30-pack-year history of smoking and currently smoke or have quit within the past 15 years.  Fecal occult blood test (FOBT) of the stool. You may have this test every year starting at age 68.  Flexible sigmoidoscopy or colonoscopy. You may have a sigmoidoscopy every 5 years or a colonoscopy every 10 years starting at age 70.  Prostate cancer screening. Recommendations will vary depending on your family history and other risks.  Hepatitis C blood test.  Hepatitis B blood test.  Sexually transmitted disease (STD) testing.  Diabetes screening. This is done by checking your blood sugar (glucose) after you have not eaten for a while (fasting). You may have this done every 1-3 years.  Abdominal aortic aneurysm (AAA) screening. You may need this if you are a current or former smoker.  Osteoporosis. You may be screened starting at age 65 if you are at high risk. Talk with your health care provider about your  test results, treatment options, and if necessary, the need for more tests. Vaccines  Your health care provider may recommend certain vaccines, such as:  Influenza vaccine. This is recommended every year.  Tetanus, diphtheria, and acellular pertussis (Tdap, Td) vaccine.  You may need a Td booster every 10 years.  Zoster vaccine. You may need this after age 68  Pneumococcal 13-valent conjugate (PCV13) vaccine. One dose is recommended after age 18.  Pneumococcal polysaccharide (PPSV23) vaccine. One dose is recommended after age 68 Talk to your health care provider about which screenings and vaccines you need and how often you need them. This information is not intended to replace advice given to you by your health care provider. Make sure you discuss any questions you have with your health care provider. Document Released: 07/01/2015 Document Revised: 02/22/2016 Document Reviewed: 04/05/2015 Elsevier Interactive Patient Education  2017 [place] Prevention in the Home Falls can cause injuries. They can happen to people of all ages. There are many things you can do to make your home safe and to help prevent falls. What can I do on the outside of my home?  Regularly fix the edges of walkways and driveways and fix any cracks.  Remove anything that might make you trip as you walk through a door, such as a raised step or threshold.  Trim any bushes or trees on the path to your home.  Use bright outdoor lighting.  Clear any walking paths of anything that might make someone trip, such as rocks or tools.  Regularly check to see if handrails are loose or broken. Make sure that both sides of any steps have handrails.  Any raised decks and porches should have guardrails on the edges.  Have any leaves, snow, or ice cleared regularly.  Use sand or salt on walking paths during winter.  Clean up any spills in your garage right away. This includes oil or grease spills. What can I do in the bathroom?  Use night lights.  Install grab bars by the toilet and in the tub and shower. Do not use towel bars as grab bars.  Use non-skid mats or decals in the tub or shower.  If you need to sit down in the shower, use a plastic, non-slip stool.  Keep the  floor dry. Clean up any water that spills on the floor as soon as it happens.  Remove soap buildup in the tub or shower regularly.  Attach bath mats securely with double-sided non-slip rug tape.  Do not have throw rugs and other things on the floor that can make you trip. What can I do in the bedroom?  Use night lights.  Make sure that you have a light by your bed that is easy to reach.  Do not use any sheets or blankets that are too big for your bed. They should not hang down onto the floor.  Have a firm chair that has side arms. You can use this for support while you get dressed.  Do not have throw rugs and other things on the floor that can make you trip. What can I do in the kitchen?  Clean up any spills right away.  Avoid walking on wet floors.  Keep items that you use a lot in easy-to-reach places.  If you need to reach something above you, use a strong step stool that has a grab bar.  Keep electrical cords out of the way.  Do not use floor polish or wax that  makes floors slippery. If you must use wax, use non-skid floor wax.  Do not have throw rugs and other things on the floor that can make you trip. What can I do with my stairs?  Do not leave any items on the stairs.  Make sure that there are handrails on both sides of the stairs and use them. Fix handrails that are broken or loose. Make sure that handrails are as long as the stairways.  Check any carpeting to make sure that it is firmly attached to the stairs. Fix any carpet that is loose or worn.  Avoid having throw rugs at the top or bottom of the stairs. If you do have throw rugs, attach them to the floor with carpet tape.  Make sure that you have a light switch at the top of the stairs and the bottom of the stairs. If you do not have them, ask someone to add them for you. What else can I do to help prevent falls?  Wear shoes that:  Do not have high heels.  Have rubber bottoms.  Are comfortable and fit  you well.  Are closed at the toe. Do not wear sandals.  If you use a stepladder:  Make sure that it is fully opened. Do not climb a closed stepladder.  Make sure that both sides of the stepladder are locked into place.  Ask someone to hold it for you, if possible.  Clearly mark and make sure that you can see:  Any grab bars or handrails.  First and last steps.  Where the edge of each step is.  Use tools that help you move around (mobility aids) if they are needed. These include:  Canes.  Walkers.  Scooters.  Crutches.  Turn on the lights when you go into a dark area. Replace any light bulbs as soon as they burn out.  Set up your furniture so you have a clear path. Avoid moving your furniture around.  If any of your floors are uneven, fix them.  If there are any pets around you, be aware of where they are.  Review your medicines with your doctor. Some medicines can make you feel dizzy. This can increase your chance of falling. Ask your doctor what other things that you can do to help prevent falls. This information is not intended to replace advice given to you by your health care provider. Make sure you discuss any questions you have with your health care provider. Document Released: 03/31/2009 Document Revised: 11/10/2015 Document Reviewed: 07/09/2014 Elsevier Interactive Patient Education  2017 [employer].

## 2020-05-03 NOTE — Progress Notes (Signed)
Established patient visit   Patient: Anthony Reynolds   DOB: [birth year]   69 y.o. Male  MRN: [id number] Visit Date: 05/03/2020  Today's healthcare provider: Lelon Huh, MD   Chief Complaint  Patient presents with  . Hypertension   Subjective    HPI  Hypertension, follow-up  BP Readings from Last 3 Encounters:  05/03/20 138/82  02/08/20 123/82  02/05/20 (!) 147/99   Wt Readings from Last 3 Encounters:  05/03/20 201 lb (91.2 kg)  02/08/20 197 lb 6.4 oz (89.5 kg)  02/05/20 190 lb (86.2 kg)     He was last seen for hypertension 6 months ago.  BP at that visit was 134/80. Management since that visit includes continue same medication.  He reports good compliance with treatment. He is not having side effects.  He is following a Regular diet. He is not exercising. He does not smoke.  Use of agents associated with hypertension: none.   Outside blood pressures are 128/-135/80. Symptoms: No chest pain No chest pressure  No palpitations No syncope  No dyspnea No orthopnea  No paroxysmal nocturnal dyspnea No lower extremity edema   Pertinent labs: Lab Results  Component Value Date   CHOL 181 04/17/2017   HDL 54 04/17/2017   LDLCALC 113 (H) 04/17/2017   TRIG 57 04/17/2017   CHOLHDL 3.4 04/17/2017   Lab Results  Component Value Date   NA 140 07/14/2018   K 4.2 07/14/2018   CREATININE 1.04 07/14/2018   GFRNONAA 74 07/14/2018   GFRAA 86 07/14/2018   GLUCOSE 87 04/28/2019     The ASCVD Risk score Mikey Bussing DC Jr., et al., 2013) failed to calculate for the following reasons:   Cannot find a previous HDL lab   Cannot find a previous total cholesterol lab   ---------------------------------------------------------------------------------------------------  Follow up for osteoarthritis of both knees:  The patient was last seen for this 1 year ago. Changes made at last visit include starting Meloxicam 7.5mg  daily.  He reports good compliance with treatment. He  feels that condition is Unchanged. Patient says the Meloxicam didn't help, so he stopped taking it.     -----------------------------------------------------------------------------------------      Medications: Outpatient Medications Prior to Visit  Medication Sig  . amLODipine (NORVASC) 5 MG tablet Take 1 tablet (5 mg total) by mouth daily.  . tadalafil (CIALIS) 20 MG tablet Take 1 tablet (20 mg total) by mouth daily as needed for erectile dysfunction.  . meloxicam (MOBIC) 7.5 MG tablet Take 1 tablet (7.5 mg total) by mouth daily as needed for pain. (Patient not taking: Reported on 05/03/2020)  . [DISCONTINUED] doxycycline (DORYX) 100 MG EC tablet Take 100 mg by mouth 2 (two) times daily. (Patient not taking: Reported on 05/03/2020)  . [DISCONTINUED] silver sulfADIAZINE (SILVADENE) 1 % cream Apply to affected area daily (Patient not taking: Reported on 05/03/2020)   No facility-administered medications prior to visit.    Review of Systems  Constitutional: Negative for appetite change, chills, fatigue and fever.  HENT: Negative for congestion, ear pain, hearing loss, nosebleeds and trouble swallowing.   Eyes: Negative for pain and visual disturbance.  Respiratory: Negative for cough, chest tightness and shortness of breath.   Cardiovascular: Negative for chest pain, palpitations and leg swelling.  Gastrointestinal: Negative for abdominal pain, blood in stool, constipation, diarrhea, nausea and vomiting.  Endocrine: Negative for polydipsia, polyphagia and polyuria.  Genitourinary: Negative for dysuria and flank pain.  Musculoskeletal: Negative for arthralgias, back pain, joint swelling, myalgias  and neck stiffness.  Skin: Negative for color change, rash and wound.  Neurological: Negative for dizziness, tremors, seizures, speech difficulty, weakness, light-headedness and headaches.  Psychiatric/Behavioral: Negative for behavioral problems, confusion, decreased concentration, dysphoric  mood and sleep disturbance. The patient is not nervous/anxious.   All other systems reviewed and are negative.     Objective    BP 138/82 (BP Location: Right Arm, Patient Position: Sitting, Cuff Size: Large)   Pulse 65   Temp 98.3 F (36.8 C) (Oral)   Resp 18   Wt 201 lb (91.2 kg)   BMI 29.68 kg/m    Physical Exam    General: Appearance:     Overweight male in no acute distress  Eyes:    PERRL, conjunctiva/corneas clear, EOM's intact       Lungs:     Clear to auscultation bilaterally, respirations unlabored  Heart:    Normal heart rate. Normal rhythm. No murmurs, rubs, or gallops.   MS:   All extremities are intact.   Neurologic:   Awake, alert, oriented x 3. No apparent focal neurological           defect.         Assessment & Plan     1. Essential hypertension Well controlled.  Continue current medications.    2. Need for influenza vaccination  - Flu Vaccine QUAD High Dose IM (Fluad)  Has had two dose of J&J Covid vaccine  3. History of colon polyps Last colonoscopy May 2020. Will send for pathology report, but patient thinks he was told in letter by Dr. Tiffany Kocher to repeat in 5 years.       The entirety of the information documented in the History of Present Illness, Review of Systems and Physical Exam were personally obtained by me. Portions of this information were initially documented by the CMA and reviewed by me for thoroughness and accuracy.      Lelon Huh, MD  [facility] [phone] (phone) [phone] (fax)  [place]

## 2020-05-04 LAB — COMPREHENSIVE METABOLIC PANEL
ALT: 5 IU/L (ref 0–44)
AST: 12 IU/L (ref 0–40)
Albumin/Globulin Ratio: 2.4 — ABNORMAL HIGH (ref 1.2–2.2)
Albumin: 4.5 g/dL (ref 3.8–4.8)
Alkaline Phosphatase: 69 IU/L (ref 44–121)
BUN/Creatinine Ratio: 18 (ref 10–24)
BUN: 20 mg/dL (ref 8–27)
Bilirubin Total: 0.6 mg/dL (ref 0.0–1.2)
CO2: 22 mmol/L (ref 20–29)
Calcium: 9 mg/dL (ref 8.6–10.2)
Chloride: 104 mmol/L (ref 96–106)
Creatinine, Ser: 1.09 mg/dL (ref 0.76–1.27)
GFR calc Af Amer: 80 mL/min/{1.73_m2} (ref >59)
GFR calc non Af Amer: 69 mL/min/{1.73_m2} (ref >59)
Globulin, Total: 1.9 g/dL (ref 1.5–4.5)
Glucose: 102 mg/dL — ABNORMAL HIGH (ref 65–99)
Potassium: 4.5 mmol/L (ref 3.5–5.2)
Sodium: 139 mmol/L (ref 134–144)
Total Protein: 6.4 g/dL (ref 6.0–8.5)

## 2020-05-04 LAB — LIPID PANEL
Chol/HDL Ratio: 4.5 ratio (ref 0.0–5.0)
Cholesterol, Total: 199 mg/dL (ref 100–199)
HDL: 44 mg/dL (ref >39)
LDL Chol Calc (NIH): 139 mg/dL — ABNORMAL HIGH (ref 0–99)
Triglycerides: 87 mg/dL (ref 0–149)
VLDL Cholesterol Cal: 16 mg/dL (ref 5–40)

## 2020-05-04 LAB — PSA: Prostate Specific Ag, Serum: 0.8 ng/mL (ref 0.0–4.0)

## 2020-07-01 DIAGNOSIS — T24392S Burn of third degree of multiple sites of left lower limb, except ankle and foot, sequela: Secondary | ICD-10-CM | POA: Diagnosis not present

## 2020-07-01 DIAGNOSIS — T24392D Burn of third degree of multiple sites of left lower limb, except ankle and foot, subsequent encounter: Secondary | ICD-10-CM | POA: Diagnosis not present

## 2020-07-01 DIAGNOSIS — T24391S Burn of third degree of multiple sites of right lower limb, except ankle and foot, sequela: Secondary | ICD-10-CM | POA: Diagnosis not present

## 2020-07-01 DIAGNOSIS — Z6829 Body mass index (BMI) 29.0-29.9, adult: Secondary | ICD-10-CM | POA: Diagnosis not present

## 2020-07-01 DIAGNOSIS — T24391D Burn of third degree of multiple sites of right lower limb, except ankle and foot, subsequent encounter: Secondary | ICD-10-CM | POA: Diagnosis not present

## 2020-07-01 DIAGNOSIS — G629 Polyneuropathy, unspecified: Secondary | ICD-10-CM | POA: Diagnosis not present

## 2020-10-09 ENCOUNTER — Telehealth: Payer: Self-pay | Admitting: Family Medicine

## 2020-10-09 DIAGNOSIS — I10 Essential (primary) hypertension: Secondary | ICD-10-CM
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
## 2020-10-11 MED ORDER — AMLODIPINE BESYLATE 5 MG PO TABS: 1.0000 | ORAL_TABLET | Freq: Every day | ORAL | 2 refills | Status: DC

## 2020-10-11 NOTE — Addendum Note (Signed)
Addended by: Birdie Sons on: 10/11/2020 10:21 AM   Modules accepted: Orders

## 2020-10-11 NOTE — Telephone Encounter (Signed)
Pt states he normally only comes in one time a year, and has been well maintained by his Rx. Pt states it is cheaper for him to get a 90 day as well. amLODipine (NORVASC) 5 MG tablet  Pt has appt in November  [place], [place]

## 2021-03-06 DIAGNOSIS — H2513 Age-related nuclear cataract, bilateral: Secondary | ICD-10-CM | POA: Diagnosis not present

## 2021-03-06 DIAGNOSIS — H43811 Vitreous degeneration, right eye: Secondary | ICD-10-CM | POA: Diagnosis not present

## 2021-03-06 DIAGNOSIS — H02889 Meibomian gland dysfunction of unspecified eye, unspecified eyelid: Secondary | ICD-10-CM | POA: Diagnosis not present

## 2021-05-08 ENCOUNTER — Encounter: Payer: Medicare Other | Admitting: Family Medicine

## 2021-05-15 ENCOUNTER — Encounter: Payer: Self-pay | Admitting: Family Medicine

## 2021-05-15 ENCOUNTER — Ambulatory Visit (INDEPENDENT_AMBULATORY_CARE_PROVIDER_SITE_OTHER): Payer: Medicare Other | Admitting: Family Medicine

## 2021-05-15 ENCOUNTER — Other Ambulatory Visit: Payer: Self-pay

## 2021-05-15 VITALS — BP 121/85 | HR 66 | Temp 98.5°F | Resp 16 | Ht 69.0 in | Wt 200.8 lb

## 2021-05-15 DIAGNOSIS — Z125 Encounter for screening for malignant neoplasm of prostate: Secondary | ICD-10-CM | POA: Diagnosis not present

## 2021-05-15 DIAGNOSIS — R609 Edema, unspecified: Secondary | ICD-10-CM

## 2021-05-15 DIAGNOSIS — Z0000 Encounter for general adult medical examination without abnormal findings: Secondary | ICD-10-CM | POA: Diagnosis not present

## 2021-05-15 DIAGNOSIS — N5201 Erectile dysfunction due to arterial insufficiency: Secondary | ICD-10-CM | POA: Diagnosis not present

## 2021-05-15 DIAGNOSIS — I10 Essential (primary) hypertension: Secondary | ICD-10-CM

## 2021-05-15 DIAGNOSIS — Z23 Encounter for immunization: Secondary | ICD-10-CM

## 2021-05-15 MED ORDER — TADALAFIL 20 MG PO TABS: 20.0000 mg | ORAL_TABLET | Freq: Every day | ORAL | 6 refills | Status: DC | PRN

## 2021-05-15 NOTE — Patient Instructions (Signed)
Please review the attached list of medications and notify my office if there are any errors.   The CDC recommends two doses of Shingrix (the shingles vaccine) separated by 2 to 6 months for adults age 69 years and older. I recommend checking with your insurance plan regarding coverage for this vaccine.   

## 2021-05-15 NOTE — Progress Notes (Signed)
Annual Wellness Visit     Patient: Anthony Reynolds, Male    DOB: 1950/12/25, 70 y.o.   MRN: [id number] Visit Date: 05/15/2021  Today's Provider: Lelon Huh, MD   Chief Complaint  Patient presents with   Medicare Wellness   Hypertension   Subjective    Anthony Reynolds is a 70 y.o. male who presents today for his Annual Wellness Visit. He reports consuming a general diet. The patient has a physically strenuous job, but has no regular exercise apart from work.  He generally feels fairly well. He reports sleeping poorly (trouble staying asleep). He does have additional problems to discuss today (swelling in feet).  HPI Hypertension, follow-up  BP Readings from Last 3 Encounters:  05/15/21 121/85  05/03/20 138/82  02/08/20 123/82   Wt Readings from Last 3 Encounters:  05/15/21 200 lb 12.8 oz (91.1 kg)  05/03/20 201 lb (91.2 kg)  02/08/20 197 lb 6.4 oz (89.5 kg)     He was last seen for hypertension 1  year  ago.  BP at that visit was 138/82. Management since that visit includes continue same medication.  He reports good compliance with treatment. He is not having side effects.  He is following a Regular diet. He is not exercising. He does not smoke.  Use of agents associated with hypertension: none.   Outside blood pressures are 120-130/ 70-80. Symptoms: No chest pain No chest pressure  No palpitations No syncope  No dyspnea No orthopnea  No paroxysmal nocturnal dyspnea Yes lower extremity edema (in feet)   Pertinent labs: Lab Results  Component Value Date   CHOL 199 05/03/2020   HDL 44 05/03/2020   LDLCALC 139 (H) 05/03/2020   TRIG 87 05/03/2020   CHOLHDL 4.5 05/03/2020   Lab Results  Component Value Date   NA 139 05/03/2020   K 4.5 05/03/2020   CREATININE 1.09 05/03/2020   GFRNONAA 69 05/03/2020   GLUCOSE 102 (H) 05/03/2020   TSH 3.120 04/23/2018     The 10-year ASCVD risk score (Arnett DK, et al., 2019) is: 18.8%    ---------------------------------------------------------------------------------------------------    Medications: Outpatient Medications Prior to Visit  Medication Sig   amLODipine (NORVASC) 5 MG tablet Take 1 tablet (5 mg total) by mouth daily.   tadalafil (CIALIS) 20 MG tablet Take 1 tablet (20 mg total) by mouth daily as needed for erectile dysfunction.   No facility-administered medications prior to visit.    No Known Allergies  Patient Care Team: Birdie Sons, MD as PCP - General (Family Medicine) Anell Barr, OD (Optometry) Bernardo Heater, Ronda Fairly, MD (Urology)  Review of Systems  Constitutional:  Negative for appetite change, chills, fatigue and fever.  HENT:  Negative for congestion, ear pain, hearing loss, nosebleeds and trouble swallowing.   Eyes:  Negative for pain and visual disturbance.  Respiratory:  Negative for cough, chest tightness and shortness of breath.   Cardiovascular:  Negative for chest pain, palpitations and leg swelling.  Gastrointestinal:  Negative for abdominal pain, blood in stool, constipation, diarrhea, nausea and vomiting.  Endocrine: Negative for polydipsia, polyphagia and polyuria.  Genitourinary:  Negative for dysuria and flank pain.  Musculoskeletal:  Positive for arthralgias (swelling of feet). Negative for back pain, joint swelling, myalgias and neck stiffness.       Stinging pain in both legs  Skin:  Negative for color change, rash and wound.       Itchy skin in both legs  Neurological:  Positive for tremors (right hand). Negative for dizziness, seizures, speech difficulty, weakness, light-headedness and headaches.  Psychiatric/Behavioral:  Negative for behavioral problems, confusion, decreased concentration, dysphoric mood and sleep disturbance. The patient is not nervous/anxious.   All other systems reviewed and are negative.      Objective    Vitals: BP 121/85 (BP Location: Left Arm, Patient Position: Sitting, Cuff Size: Normal)    Pulse 66   Temp 98.5 F (36.9 C) (Oral)   Resp 16   Ht 5\' 9"  (1.753 m)   Wt 200 lb 12.8 oz (91.1 kg)   SpO2 99% Comment: room air  BMI 29.65 kg/m    Physical Exam  General: Appearance:     Well developed, well nourished male in no acute distress  Eyes:    PERRL, conjunctiva/corneas clear, EOM's intact       Lungs:     Clear to auscultation bilaterally, respirations unlabored  Heart:    Normal heart rate. Normal rhythm. No murmurs, rubs, or gallops.    MS:   All extremities are intact.  Trace bipedal edema, moderate varicosities of both lower legs.   Neurologic:   Awake, alert, oriented x 3. No apparent focal neurological defect.         Most recent functional status assessment: No flowsheet data found. Most recent fall risk assessment: Fall Risk  05/03/2020  Falls in the past year? 0  Comment -  Number falls in past yr: 0  Injury with Fall? 0    Most recent depression screenings: PHQ 2/9 Scores 05/03/2020 04/28/2019  PHQ - 2 Score 0 0  PHQ- 9 Score - -   Most recent cognitive screening: No flowsheet data found. Most recent Audit-C alcohol use screening Alcohol Use Disorder Test (AUDIT) 05/03/2020  1. How often do you have a drink containing alcohol? 0  2. How many drinks containing alcohol do you have on a typical day when you are drinking? 0  3. How often do you have six or more drinks on one occasion? 0  AUDIT-C Score 0  Alcohol Brief Interventions/Follow-up AUDIT Score <7 follow-up not indicated   A score of 3 or more in women, and 4 or more in men indicates increased risk for alcohol abuse, EXCEPT if all of the points are from question 1   No results found for any visits on 05/15/21.  Assessment & Plan     Annual wellness visit done today including the all of the following: Reviewed patient's Family Medical History Reviewed and updated list of patient's medical providers Assessment of cognitive impairment was done Assessed patient's functional  ability Established a written schedule for health screening [place] Completed and Reviewed  Exercise Activities and Dietary recommendations  Goals      DIET - REDUCE PORTION SIZE     Recommend to continue current diet plan of eating smaller portions and staying away from junk food or bread.         Immunization History  Administered Date(s) Administered   Fluad Quad(high Dose 65+) 04/28/2019, 05/03/2020   Hepatitis A, Adult 04/09/2014, 04/14/2015   Influenza Split 04/04/2012   Influenza, High Dose Seasonal PF 04/17/2017, 04/23/2018   Influenza,inj,Quad PF,6+ Mos 04/08/2013, 04/09/2014, 04/14/2015, 04/16/2016   Janssen (J&J) SARS-COV-2 Vaccination 09/23/2019   Pneumococcal Conjugate-13 04/17/2017   Pneumococcal Polysaccharide-23 04/23/2018   Td 04/23/2018   Tdap 07/08/2007   Zoster, Live 04/04/2012    Health Maintenance  Topic Date Due   Zoster Vaccines- Shingrix (1 of  2) Never done   COVID-19 Vaccine (2 - Booster for Janssen series) 11/18/2019   INFLUENZA VACCINE  01/16/2021   COLONOSCOPY (Pts 45-5yrs Insurance coverage will need to be confirmed)  06/27/2023   TETANUS/TDAP  04/23/2028   Pneumonia Vaccine 60+ Years old  Completed   Hepatitis C Screening  Completed   HPV VACCINES  Aged Out     Discussed health benefits of physical activity, and encouraged him to engage in regular exercise appropriate for his age and condition.    1. Essential hypertension Is well controlled, but having some edema since increasing amlodipine to 5mg . See how labs look, consider reducing amlodipine to 2.5mg  and/or adding low dose hctz.  - CBC - Comprehensive metabolic panel - Lipid panel - TSH - EKG 12-Lead  2. Edema, unspecified type As above, likely secondary to amlodipine and mild varicosities. It is very bothersome to him so will consider medication change if labs are normal.   3. Need for influenza vaccination  - Flu Vaccine QUAD High Dose IM  (Fluad)  4. Prostate cancer screening  - PSA Total (Reflex To Free) ([facility] only)  5. Erectile dysfunction due to arterial insufficiency refill tadalafil (CIALIS) 20 MG tablet; Take 1 tablet (20 mg total) by mouth daily as needed for erectile dysfunction.  Dispense: 10 tablet; Refill: 6      The entirety of the information documented in the History of Present Illness, Review of Systems and Physical Exam were personally obtained by me. Portions of this information were initially documented by the CMA and reviewed by me for thoroughness and accuracy.     Lelon Huh, MD  [facility] [phone] (phone) [phone] (fax)  [place]

## 2021-05-16 ENCOUNTER — Telehealth: Payer: Self-pay

## 2021-05-16 LAB — COMPREHENSIVE METABOLIC PANEL
ALT: 6 IU/L (ref 0–44)
AST: 15 IU/L (ref 0–40)
Albumin/Globulin Ratio: 2.1 (ref 1.2–2.2)
Albumin: 4.4 g/dL (ref 3.8–4.8)
Alkaline Phosphatase: 57 IU/L (ref 44–121)
BUN/Creatinine Ratio: 19 (ref 10–24)
BUN: 19 mg/dL (ref 8–27)
Bilirubin Total: 0.8 mg/dL (ref 0.0–1.2)
CO2: 27 mmol/L (ref 20–29)
Calcium: 9.1 mg/dL (ref 8.6–10.2)
Chloride: 101 mmol/L (ref 96–106)
Creatinine, Ser: 1 mg/dL (ref 0.76–1.27)
Globulin, Total: 2.1 g/dL (ref 1.5–4.5)
Glucose: 94 mg/dL (ref 70–99)
Potassium: 4.5 mmol/L (ref 3.5–5.2)
Sodium: 138 mmol/L (ref 134–144)
Total Protein: 6.5 g/dL (ref 6.0–8.5)
eGFR: 81 mL/min/{1.73_m2} (ref >59)

## 2021-05-16 LAB — CBC
Hematocrit: 41.2 % (ref 37.5–51.0)
Hemoglobin: 14.2 g/dL (ref 13.0–17.7)
MCH: 31.4 pg (ref 26.6–33.0)
MCHC: 34.5 g/dL (ref 31.5–35.7)
MCV: 91 fL (ref 79–97)
Platelets: 235 10*3/uL (ref 150–450)
RBC: 4.52 x10E6/uL (ref 4.14–5.80)
RDW: 13.2 % (ref 11.6–15.4)
WBC: 7.4 10*3/uL (ref 3.4–10.8)

## 2021-05-16 LAB — LIPID PANEL
Chol/HDL Ratio: 3.7 ratio (ref 0.0–5.0)
Cholesterol, Total: 174 mg/dL (ref 100–199)
HDL: 47 mg/dL (ref >39)
LDL Chol Calc (NIH): 110 mg/dL — ABNORMAL HIGH (ref 0–99)
Triglycerides: 90 mg/dL (ref 0–149)
VLDL Cholesterol Cal: 17 mg/dL (ref 5–40)

## 2021-05-16 LAB — TSH: TSH: 3.68 u[IU]/mL (ref 0.450–4.500)

## 2021-05-16 LAB — PSA TOTAL (REFLEX TO FREE): Prostate Specific Ag, Serum: 0.8 ng/mL (ref 0.0–4.0)

## 2021-05-16 NOTE — Telephone Encounter (Signed)
Yes, he can decrease 1/2 tablet daily. Let me know if the swelling doesn't improve within 2 weeks, or if BP gets above 140/90. Otherwise follow up blood pressure in 3 months.

## 2021-05-16 NOTE — Telephone Encounter (Signed)
Pt advised.  He wanted to know if he could decrease amlodipine. (See office note 05/15/2021)   Thanks,   -Mickel Baas

## 2021-05-16 NOTE — Telephone Encounter (Signed)
-----   Message from Birdie Sons, MD sent at 05/16/2021  1:41 PM EST ----- Labs all very good. Continue current medications.  Check labs yearly.

## 2021-05-17 NOTE — Telephone Encounter (Signed)
Pt's wife Butch Penny advised.  She will have Mr. Lightner call back to schedule a follow up appointment. PEC please schedule when he calls back.    Thanks,   -Mickel Baas

## 2021-06-01 ENCOUNTER — Ambulatory Visit: Payer: Self-pay | Admitting: *Deleted

## 2021-06-01 NOTE — Telephone Encounter (Signed)
Pt called in but either the line disconnected or he hung up.   He told the agent his legs were still swollen even though his BP medication had been cut in half.   I attempted to call him back but got his voicemail.   I left a message to call [facility] back.  Any of the nurses could assist him.

## 2021-06-02 ENCOUNTER — Ambulatory Visit: Payer: Self-pay

## 2021-06-02 NOTE — Telephone Encounter (Signed)
°  Chief Complaint: feet swelling Symptoms: swelling in feet Frequency: Pertinent Negatives:  Disposition: [] ED /[] Urgent Care (no appt availability in office) / [x] Appointment(In office/virtual)/ []  [facility]/ [] Home Care/ [] Refused Recommended Disposition  Additional Notes: Pt was seen on 05/15/21 with Dr. Caryn Section, had BP med adjusted and was supposed to add hctz but pt hadnt heard anything about that medication. He was told to call back in 2 weeks if swelling hadnt improved. Scheduled pt for appt 06/05/21 at 1100 with Dr. Caryn Section to f/up on bp med and swelling.   Reason for Disposition  [1] MILD swelling of both ankles (i.e., pedal edema) AND [2] is a chronic symptom (recurrent or ongoing AND present > 4 weeks)  Answer Assessment - Initial Assessment Questions 2. LOCATION: "What part of the leg is swollen?"  "Are both legs swollen or just one leg?"    Both feet, doesn't extend to ankles 3. SEVERITY: "How bad is the swelling?" (e.g., localized; mild, moderate, severe)  - Localized - small area of swelling localized to one leg  - MILD pedal edema - swelling limited to foot and ankle, pitting edema < 1/4 inch (6 mm) deep, rest and elevation eliminate most or all swelling  - MODERATE edema - swelling of lower leg to knee, pitting edema > 1/4 inch (6 mm) deep, rest and elevation only partially reduce swelling  - SEVERE edema - swelling extends above knee, facial or hand swelling present      mild 4. REDNESS: "Does the swelling look red or infected?"     No 5. PAIN: "Is the swelling painful to touch?" If Yes, ask: "How painful is it?"   (Scale 1-10; mild, moderate or severe)     No  Protocols used: Leg Swelling and Edema-A-AH

## 2021-06-05 ENCOUNTER — Ambulatory Visit: Payer: Medicare Other | Admitting: Family Medicine

## 2021-06-05 MED ORDER — HYDROCHLOROTHIAZIDE 25 MG PO TABS: 25.0000 mg | ORAL_TABLET | Freq: Every day | ORAL | 1 refills | Status: DC

## 2021-06-05 NOTE — Addendum Note (Signed)
Addended by: Birdie Sons on: 06/05/2021 08:22 AM   Modules accepted: Orders

## 2021-06-05 NOTE — Progress Notes (Deleted)
°  ° ° °  Established patient visit   Patient: Anthony Reynolds   DOB: February 18, 1951   70 y.o. Male  MRN: [id number] Visit Date: 06/05/2021  Today's healthcare provider: Lelon Huh, MD   No chief complaint on file.  Subjective    HPI  Follow up for Edema:  The patient was last seen for this 3 weeks ago. Changes made at last visit include; having some edema since increasing amlodipine to 5mg . See how labs look, consider reducing amlodipine to 2.5mg  and/or adding low dose hctz. Labs checked showing-normal. Advised to continue current medications.  He reports {excellent/good/fair/poor:19665} compliance with treatment. He feels that condition is {improved/worse/unchanged:3041574}. He {is/is not:21021397} having side effects. ***  -----------------------------------------------------------------------------------------   {Link to patient history deactivated due to formatting error:1}  Medications: Outpatient Medications Prior to Visit  Medication Sig   amLODipine (NORVASC) 5 MG tablet Take 1 tablet (5 mg total) by mouth daily.   tadalafil (CIALIS) 20 MG tablet Take 1 tablet (20 mg total) by mouth daily as needed for erectile dysfunction.   No facility-administered medications prior to visit.    Review of Systems  Constitutional:  Negative for appetite change, chills and fever.  Respiratory:  Negative for chest tightness, shortness of breath and wheezing.   Cardiovascular:  Negative for chest pain and palpitations.  Gastrointestinal:  Negative for abdominal pain, nausea and vomiting.   {Labs   Heme   Chem   Endocrine   Serology   Results Review (optional):23779}   Objective    There were no vitals taken for this visit. {Show previous vital signs (optional):23777}  Physical Exam  ***  No results found for any visits on 06/05/21.  Assessment & Plan     ***  No follow-ups on file.      {provider attestation***:1}   Lelon Huh, MD  [facility] [phone] (phone) [phone] (fax)  [place]

## 2021-06-05 NOTE — Telephone Encounter (Signed)
Please advise have sent prescription for hctz to his pharmacy which should help the swelling. He doesn't need to come in today, but should schedule a follow up in about 3 weeks to check his BP and swelling.

## 2021-06-26 ENCOUNTER — Ambulatory Visit: Payer: Self-pay | Admitting: *Deleted

## 2021-06-26 NOTE — Telephone Encounter (Signed)
Pt advised.   Thanks,   -Dereke Neumann  

## 2021-06-26 NOTE — Telephone Encounter (Signed)
Pt stated that he has been taking half (2.5 mg) amlodipine  for 6 weeks.   1. Essential hypertension (from 05/15/21) Is well controlled, but having some edema since increasing amlodipine to 5mg . See how labs look, consider reducing amlodipine to 2.5mg  and/or adding low dose hctz.   Pt requesting a call back what to do next.

## 2021-06-26 NOTE — Telephone Encounter (Signed)
Reason for Disposition  [1] Small area of LOCALIZED swelling AND [2] itchy    Did not report itching  Answer Assessment - Initial Assessment Questions 1. ONSET: "When did the swelling start?" (e.g., minutes, hours, days)     A few weeks ago . PCP aware  2. LOCATION: "What part of the leg is swollen?"  "Are both legs swollen or just one leg?"     Mostly toes and feet  3. SEVERITY: "How bad is the swelling?" (e.g., localized; mild, moderate, severe)  - Localized - small area of swelling localized to one leg  - MILD pedal edema - swelling limited to foot and ankle, pitting edema < 1/4 inch (6 mm) deep, rest and elevation eliminate most or all swelling  - MODERATE edema - swelling of lower leg to knee, pitting edema > 1/4 inch (6 mm) deep, rest and elevation only partially reduce swelling  - SEVERE edema - swelling extends above knee, facial or hand swelling present      Not bad but no change since taking HCTZ 4. REDNESS: "Does the swelling look red or infected?"     na 5. PAIN: "Is the swelling painful to touch?" If Yes, ask: "How painful is it?"   (Scale 1-10; mild, moderate or severe)     discomfort 6. FEVER: "Do you have a fever?" If Yes, ask: "What is it, how was it measured, and when did it start?"      na 7. CAUSE: "What do you think is causing the leg swelling?"     Not sure  8. MEDICAL HISTORY: "Do you have a history of heart failure, kidney disease, liver failure, or cancer?"     na 9. RECURRENT SYMPTOM: "Have you had leg swelling before?" If Yes, ask: "When was the last time?" "What happened that time?"     Yes . Seen by PCP 10. OTHER SYMPTOMS: "Do you have any other symptoms?" (e.g., chest pain, difficulty breathing)      Hands swelling not as much as toes  11. PREGNANCY: "Is there any chance you are pregnant?" "When was your last menstrual period?"       na  Protocols used: Leg Swelling and Edema-A-AH

## 2021-06-26 NOTE — Telephone Encounter (Signed)
Summary: Hands/feet swelling   Pt stated he has a week left of this medication hydrochlorothiazide (HYDRODIURIL) 25 MG tablet however, he feels this medication may not be helping him.  Stated he is still experiencing swelling in both feet. Stated swelling is not as bad but it hasn't gotten better feels a lot of discomfort.  Pt stated hands are swelling as well not as much as feet.   Pt unable to answer the phone between [place].   Seeking clinical advice.        Chief Complaint: called back to report swelling in bilateral toes on feet and some swelling noted in hands since started HCTZ no change in swelling or urination Symptoms: bilateral toes "puffy" and some discomfort , bilateral hands " little swollen but not like toes and feet" Frequency: since starting HCTZ 25 mg  Pertinent Negatives: Patient denies chest pain, no difficulty breathing , no pain Disposition: [] ED /[] Urgent Care (no appt availability in office) / [] Appointment(In office/virtual)/ []  [facility]/ [x] Home Care/ [] Refused Recommended Disposition /[] [facility]/ []  Follow-up with PCP Additional Notes:  Please advise if increase in medication needed. Patient reports only 1 week worth of medication HCTZ left. Do you want another appt scheduled or change medication ?

## 2021-06-26 NOTE — Telephone Encounter (Signed)
If his blood pressure is good and is already taking 1/2 tablet of amlodipine, then he could just discontinue the amlodipine completely. If his blood pressure is running over 140/90 the then we could change to amlodipine to something else.

## 2021-06-26 NOTE — Telephone Encounter (Signed)
If his blood pressure has been good (under 140/90) then he can reduce the amlodipine to 1/2 tablet once a day, continue hctz 25mg  a day, and follow up in 1 month

## 2021-06-26 NOTE — Telephone Encounter (Signed)
LMTCB 06/26/2021.  PEC please advise as below when he calls back.   Thanks,   -Mickel Baas

## 2021-07-17 ENCOUNTER — Other Ambulatory Visit: Payer: Self-pay | Admitting: Family Medicine

## 2021-07-17 DIAGNOSIS — I10 Essential (primary) hypertension: Secondary | ICD-10-CM

## 2021-07-31 ENCOUNTER — Telehealth: Payer: Self-pay | Admitting: Family Medicine

## 2021-07-31 DIAGNOSIS — I10 Essential (primary) hypertension: Secondary | ICD-10-CM

## 2021-07-31 MED ORDER — HYDROCHLOROTHIAZIDE 25 MG PO TABS: 25.0000 mg | ORAL_TABLET | Freq: Every day | ORAL | 1 refills | Status: DC

## 2021-07-31 NOTE — Telephone Encounter (Signed)
Have sent refill to [facility]. Continue 25mg  a day for now. Will see how his BP and labs are doing at his follow up in March to determine if an changes need to made.

## 2021-07-31 NOTE — Telephone Encounter (Signed)
Pt called back and given Dr Caryn Section message word for word. Pt verbalized understanding.

## 2021-07-31 NOTE — Telephone Encounter (Signed)
Pt would like to know if Dr Caryn Section wants him to stay on the hydrochlorothiazide (HYDRODIURIL) 25 MG tablet Pt states his feet are still swollen, and he does not seem to go to the restroom anymore than he did before. Please advise, as there are no more refills on this medication.

## 2021-08-16 ENCOUNTER — Other Ambulatory Visit: Payer: Self-pay | Admitting: Family Medicine

## 2021-08-16 DIAGNOSIS — N5201 Erectile dysfunction due to arterial insufficiency: Secondary | ICD-10-CM
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
## 2021-08-18 ENCOUNTER — Encounter: Payer: Self-pay | Admitting: Family Medicine

## 2021-08-18 ENCOUNTER — Other Ambulatory Visit: Payer: Self-pay

## 2021-08-18 ENCOUNTER — Ambulatory Visit (INDEPENDENT_AMBULATORY_CARE_PROVIDER_SITE_OTHER): Payer: Medicare HMO | Admitting: Family Medicine

## 2021-08-18 VITALS — BP 143/99 | HR 71 | Temp 98.0°F | Resp 16 | Wt 202.0 lb

## 2021-08-18 DIAGNOSIS — I10 Essential (primary) hypertension: Secondary | ICD-10-CM | POA: Diagnosis not present

## 2021-08-18 DIAGNOSIS — I890 Lymphedema, not elsewhere classified: Secondary | ICD-10-CM | POA: Diagnosis not present

## 2021-08-18 MED ORDER — VALSARTAN 80 MG PO TABS: 80.0000 mg | ORAL_TABLET | Freq: Every day | ORAL | 1 refills | Status: DC

## 2021-08-18 NOTE — Progress Notes (Unsigned)
°  ° ° °  Established patient visit   Patient: Anthony Reynolds   DOB: 21-Oct-1950   71 y.o. Male  MRN: [id number] Visit Date: 09/18/2021  Today's healthcare provider: Lelon Huh, MD   No chief complaint on file.  Subjective    HPI  He presents today to follow up on hypertension and LE lymphedema since adding valsartan at last visit on 08/18/2021 due to uncontrolled blood pressure and persistent but mild edema.   Medications: Outpatient Medications Prior to Visit  Medication Sig   hydrochlorothiazide (HYDRODIURIL) 25 MG tablet Take 1 tablet (25 mg total) by mouth daily.   tadalafil (CIALIS) 20 MG tablet TAKE ONE TABLET BY MOUTH NOT MORE THAN ONCE DAILY AS NEEDED FOR ERECTILE DYSFUNCTION   valsartan (DIOVAN) 80 MG tablet Take 1 tablet (80 mg total) by mouth daily.   No facility-administered medications prior to visit.    Review of Systems  {Labs   Heme   Chem   Endocrine   Serology   Results Review (optional):23779}   Objective    There were no vitals taken for this visit. {Show previous vital signs (optional):23777}  Physical Exam  ***  No results found for any visits on 09/18/21.  Assessment & Plan     ***  No follow-ups on file.      {provider attestation***:1}   Lelon Huh, MD  [facility] [phone] (phone) [phone] (fax)  [facility]

## 2021-09-18 ENCOUNTER — Ambulatory Visit (INDEPENDENT_AMBULATORY_CARE_PROVIDER_SITE_OTHER): Payer: Medicare HMO | Admitting: Family Medicine

## 2021-09-18 ENCOUNTER — Encounter: Payer: Self-pay | Admitting: Family Medicine

## 2021-09-18 VITALS — BP 129/83 | HR 62 | Temp 97.7°F | Wt 199.0 lb

## 2021-09-18 DIAGNOSIS — I10 Essential (primary) hypertension: Secondary | ICD-10-CM | POA: Diagnosis not present

## 2021-09-18 DIAGNOSIS — M25511 Pain in right shoulder: Secondary | ICD-10-CM

## 2021-09-18 DIAGNOSIS — M25512 Pain in left shoulder: Secondary | ICD-10-CM

## 2021-09-18 NOTE — Patient Instructions (Signed)
.   Please review the attached list of medications and notify my office if there are any errors.   . Please bring all of your medications to every appointment so we can make sure that our medication list is the same as yours.   

## 2021-09-19 ENCOUNTER — Telehealth: Payer: Self-pay | Admitting: *Deleted

## 2021-09-19 DIAGNOSIS — I10 Essential (primary) hypertension: Secondary | ICD-10-CM

## 2021-09-19 LAB — RENAL FUNCTION PANEL
Albumin: 4.8 g/dL (ref 3.8–4.8)
BUN/Creatinine Ratio: 18 (ref 10–24)
BUN: 21 mg/dL (ref 8–27)
CO2: 21 mmol/L (ref 20–29)
Calcium: 9.2 mg/dL (ref 8.6–10.2)
Chloride: 103 mmol/L (ref 96–106)
Creatinine, Ser: 1.16 mg/dL (ref 0.76–1.27)
Glucose: 102 mg/dL — ABNORMAL HIGH (ref 70–99)
Phosphorus: 2.7 mg/dL — ABNORMAL LOW (ref 2.8–4.1)
Potassium: 4.7 mmol/L (ref 3.5–5.2)
Sodium: 137 mmol/L (ref 134–144)
eGFR: 68 mL/min/{1.73_m2} (ref >59)

## 2021-09-19 MED ORDER — VALSARTAN-HYDROCHLOROTHIAZIDE 80-12.5 MG PO TABS: 1.0000 | ORAL_TABLET | Freq: Every day | ORAL | 3 refills | Status: DC

## 2021-09-19 NOTE — Telephone Encounter (Signed)
Copied from [place] [id number]. Topic: General - Other ?>> Sep 19, 2021 12:03 PM Leward Quan A wrote: ?Reason for CRM: Patient called in to inquire of Dr Caryn Section about the Rx he said he would send to the pharmacy after the lab work come back and they get checked.. Patient does not know what medication would have been sent but was curious. Please advise and call Ph# [phone] ?

## 2021-09-19 NOTE — Telephone Encounter (Signed)
Please advise 

## 2021-09-19 NOTE — Telephone Encounter (Signed)
Labs good, have sent prescription for valsartan-hctz which will take the place of both valsartan 80 and hctz 25 that he is currently taking. Follow up in November as scheduled.  ?

## 2021-09-20 NOTE — Telephone Encounter (Signed)
Patient notified of provider response and medication change.  ?

## 2022-01-19 ENCOUNTER — Ambulatory Visit: Payer: Self-pay

## 2022-01-19 NOTE — Telephone Encounter (Signed)
If not having any other symptoms then he can reduce valsartan -hctz to 1/2 tablet daily and push fluids until BP comes back up. Otherwise need to go to urgent care to get checked out and labs.

## 2022-01-19 NOTE — Telephone Encounter (Signed)
    Chief Complaint: BP running low x 2 days. Today 99/67, 120/80, 109/72 pulse 75-79. Lightheaded when bending over. Symptoms: Above Frequency: Yesterday Pertinent Negatives: Patient denies chest pain Disposition: '[]'$ ED /'[]'$ Urgent Care (no appt availability in office) / '[x]'$ Appointment(In office/virtual)/ '[]'$  [facility]/ '[]'$ Home Care/ '[]'$ Refused Recommended Disposition /'[]'$ [facility]/ '[x]'$  Follow-up with PCP Additional Notes: Asking to be worked in sooner.Please advise pt.  Answer Assessment - Initial Assessment Questions 1. BLOOD PRESSURE: "What is the blood pressure?" "Did you take at least two measurements 5 minutes apart?"     99/67    and then  2. ONSET: "When did you take your blood pressure?"     Today 3. HOW: "How did you obtain the blood pressure?" (e.g., visiting nurse, automatic home BP monitor)     Home cuff 4. HISTORY: "Do you have a history of low blood pressure?" "What is your blood pressure normally?"     No 5. MEDICINES: "Are you taking any medications for blood pressure?" If Yes, ask: "Have they been changed recently?"     Yes 6. PULSE RATE: "Do you know what your pulse rate is?"      75-79 7. OTHER SYMPTOMS: "Have you been sick recently?" "Have you had a recent injury?"     No 8. PREGNANCY: "Is there any chance you are pregnant?" "When was your last menstrual period?"     N/A  Protocols used: Blood Pressure - Low-A-AH

## 2022-01-22 NOTE — Progress Notes (Unsigned)
I,Jana Nyheem Binette,acting as a Education administrator for [employer], PA-C.,have documented all relevant documentation on the behalf of Anthony Speak, PA-C,as directed by  [employer], PA-C while in the presence of [employer], PA-C.   Established patient visit   Patient: Anthony Reynolds   DOB: 1951-05-20   71 y.o. Male  MRN: [id number] Visit Date: 01/23/2022  Today's healthcare provider: Mardene Speak, PA-C   CC; BP check  Subjective    Patient presents for low blood pressure readings with lightheadedness when bending over. Onset Friday and reports blood pressure slowing coming back up. Per Dr. Caryn Section on Friday, he can reduce Valsartan-hctz to 1/2 tablet daily and push fluids until BP comes back up.  Patient states he did not hear back from office on Friday and he has not taken his medication since. Reports he feels fine today.   Medications: Outpatient Medications Prior to Visit  Medication Sig Note   tadalafil (CIALIS) 20 MG tablet TAKE ONE TABLET BY MOUTH NOT MORE THAN ONCE DAILY AS NEEDED FOR ERECTILE DYSFUNCTION    valsartan-hydrochlorothiazide (DIOVAN-HCT) 80-12.5 MG tablet Take 1 tablet by mouth daily. (Patient not taking: Reported on 01/23/2022) 01/23/2022: Not taken since Saturday   No facility-administered medications prior to visit.    Review of Systems  Constitutional: Negative.   Respiratory: Negative.    Cardiovascular: Negative.   Gastrointestinal: Negative.     {Labs  Heme  Chem  Endocrine  Serology  Results Review (optional):23779}   Objective    BP (!) 142/88 (BP Location: Left Arm, Patient Position: Sitting, Cuff Size: Normal)   Pulse (!) 59   Temp 98.3 F (36.8 C) (Oral)   Resp 16   Wt 199 lb (90.3 kg)   SpO2 100%   BMI 29.39 kg/m  {Show previous vital signs (optional):23777}  Physical Exam Vitals reviewed.  Constitutional:      General: He is not in acute distress.    Appearance: Normal appearance. He is not diaphoretic.  HENT:     Head:  Normocephalic and atraumatic.  Eyes:     General: No scleral icterus.    Extraocular Movements: Extraocular movements intact.     Conjunctiva/sclera: Conjunctivae normal.     Pupils: Pupils are equal, round, and reactive to light.  Cardiovascular:     Rate and Rhythm: Normal rate and regular rhythm.     Pulses: Normal pulses.     Heart sounds: Normal heart sounds. No murmur heard. Pulmonary:     Effort: Pulmonary effort is normal. No respiratory distress.     Breath sounds: Normal breath sounds. No wheezing or rhonchi.  Musculoskeletal:     Cervical back: Neck supple.     Right lower leg: No edema.     Left lower leg: No edema.  Lymphadenopathy:     Cervical: No cervical adenopathy.  Skin:    General: Skin is warm and dry.     Findings: No rash.  Neurological:     General: No focal deficit present.     Mental Status: He is alert and oriented to person, place, and time. Mental status is at baseline.  Psychiatric:        Behavior: Behavior normal.        Thought Content: Thought content normal.        Judgment: Judgment normal.      No results found for any visits on 01/23/22.  Assessment & Plan     1. Essential hypertension BP today was 142/88 Advised  to continue 1/2 tab of BP med Per pt, his annual Eye exam was scheduled for next week Continue to stay active And adhere to healthy diet  2. Pigmented lesion on the forehead Could be SK?  Referred to dermatology for removal ?  3. Onychomycosis Chronic.  Pt prefers not to do any RX treatment, uses homemade remedies? I"t is under control."  4. Pt believes that he might had swollen feet On PE, no leg swelling or pitting edema appreciated  FU with Dr. Caryn Section in October unless:     The patient was advised to call back or seek an in-person evaluation if the symptoms worsen or if the condition fails to improve as anticipated.  I discussed the assessment and treatment plan with the patient. The patient was provided an  opportunity to ask questions and all were answered. The patient agreed with the plan and demonstrated an understanding of the instructions.  The entirety of the information documented in the History of Present Illness, Review of Systems and Physical Exam were personally obtained by me. Portions of this information were initially documented by the CMA and reviewed by me for thoroughness and accuracy.  Portions of this note were created using dictation software and may contain typographical errors.     Anthony Speak, PA-C  [facility] [phone] (phone) [phone] (fax)  [place]

## 2022-01-23 ENCOUNTER — Ambulatory Visit (INDEPENDENT_AMBULATORY_CARE_PROVIDER_SITE_OTHER): Payer: Medicare HMO | Admitting: Physician Assistant

## 2022-01-23 ENCOUNTER — Encounter: Payer: Self-pay | Admitting: Physician Assistant

## 2022-01-23 VITALS — BP 147/89 | HR 59 | Temp 98.3°F | Resp 16 | Wt 199.0 lb

## 2022-01-23 DIAGNOSIS — I10 Essential (primary) hypertension: Secondary | ICD-10-CM

## 2022-01-23 DIAGNOSIS — L989 Disorder of the skin and subcutaneous tissue, unspecified: Secondary | ICD-10-CM | POA: Diagnosis not present

## 2022-01-24 LAB — RENAL FUNCTION PANEL
Albumin: 4.6 g/dL (ref 3.9–4.9)
BUN/Creatinine Ratio: 21 (ref 10–24)
BUN: 24 mg/dL (ref 8–27)
CO2: 21 mmol/L (ref 20–29)
Calcium: 9 mg/dL (ref 8.6–10.2)
Chloride: 102 mmol/L (ref 96–106)
Creatinine, Ser: 1.12 mg/dL (ref 0.76–1.27)
Glucose: 94 mg/dL (ref 70–99)
Phosphorus: 2.2 mg/dL — ABNORMAL LOW (ref 2.8–4.1)
Potassium: 4.7 mmol/L (ref 3.5–5.2)
Sodium: 136 mmol/L (ref 134–144)
eGFR: 71 mL/min/{1.73_m2} (ref >59)

## 2022-01-24 NOTE — Progress Notes (Signed)
Hello Anthony Reynolds ,   Your labwork results all are within normal limits/stable for you There are a slight decrease in phosphorus, please, adhere to phosphorus-rich diet/ milk, nuts, beans, and liver Any questions please reach out to the office or message me on MyChart!  Best, Mardene Speak, PA-C

## 2022-03-07 DIAGNOSIS — H02889 Meibomian gland dysfunction of unspecified eye, unspecified eyelid: Secondary | ICD-10-CM | POA: Diagnosis not present

## 2022-03-07 DIAGNOSIS — H43811 Vitreous degeneration, right eye: Secondary | ICD-10-CM | POA: Diagnosis not present

## 2022-03-07 DIAGNOSIS — H524 Presbyopia: Secondary | ICD-10-CM | POA: Diagnosis not present

## 2022-03-07 DIAGNOSIS — H2513 Age-related nuclear cataract, bilateral: Secondary | ICD-10-CM | POA: Diagnosis not present

## 2022-03-07 DIAGNOSIS — H353131 Nonexudative age-related macular degeneration, bilateral, early dry stage: Secondary | ICD-10-CM | POA: Diagnosis not present

## 2022-05-07 ENCOUNTER — Telehealth: Payer: Self-pay

## 2022-05-07 DIAGNOSIS — I10 Essential (primary) hypertension: Secondary | ICD-10-CM

## 2022-05-07 DIAGNOSIS — Z125 Encounter for screening for malignant neoplasm of prostate: Secondary | ICD-10-CM

## 2022-05-07 NOTE — Addendum Note (Signed)
Addended by: Birdie Sons on: 05/07/2022 09:56 AM   Modules accepted: Orders

## 2022-05-07 NOTE — Telephone Encounter (Signed)
Patient's wife advised

## 2022-05-07 NOTE — Telephone Encounter (Signed)
Copied from [place] [id number]. Topic: Appointment Scheduling - Scheduling Inquiry for Clinic >> May 07, 2022  8:39 AM Devoria Glassing wrote: Reason for CRM: pt wants to know if he can have lab orders prior to his 11/29 appt w/ Dr Caryn Section?

## 2022-05-07 NOTE — Telephone Encounter (Signed)
That's fine. Have printed order. Needs to be fasting.

## 2022-05-16 ENCOUNTER — Encounter: Payer: Self-pay | Admitting: Family Medicine

## 2022-05-16 ENCOUNTER — Ambulatory Visit (INDEPENDENT_AMBULATORY_CARE_PROVIDER_SITE_OTHER): Payer: Medicare HMO | Admitting: Family Medicine

## 2022-05-16 VITALS — BP 136/78 | HR 61 | Temp 98.5°F | Resp 20 | Ht 69.0 in | Wt 197.0 lb

## 2022-05-16 DIAGNOSIS — Z23 Encounter for immunization: Secondary | ICD-10-CM | POA: Diagnosis not present

## 2022-05-16 DIAGNOSIS — I10 Essential (primary) hypertension: Secondary | ICD-10-CM

## 2022-05-16 DIAGNOSIS — Z0000 Encounter for general adult medical examination without abnormal findings: Secondary | ICD-10-CM | POA: Diagnosis not present

## 2022-05-16 DIAGNOSIS — N529 Male erectile dysfunction, unspecified: Secondary | ICD-10-CM

## 2022-05-16 DIAGNOSIS — N5201 Erectile dysfunction due to arterial insufficiency: Secondary | ICD-10-CM

## 2022-05-16 DIAGNOSIS — Z125 Encounter for screening for malignant neoplasm of prostate: Secondary | ICD-10-CM | POA: Diagnosis not present

## 2022-05-16 DIAGNOSIS — J40 Bronchitis, not specified as acute or chronic: Secondary | ICD-10-CM | POA: Diagnosis not present

## 2022-05-16 LAB — POC COVID19 BINAXNOW: SARS Coronavirus 2 Ag: NEGATIVE

## 2022-05-16 MED ORDER — TADALAFIL 20 MG PO TABS: ORAL_TABLET | ORAL | 3 refills | Status: DC

## 2022-05-16 MED ORDER — HYDROCHLOROTHIAZIDE 12.5 MG PO TABS: 12.5000 mg | ORAL_TABLET | Freq: Every day | ORAL | 3 refills | Status: DC

## 2022-05-16 MED ORDER — VALSARTAN 40 MG PO TABS: 40.0000 mg | ORAL_TABLET | Freq: Every day | ORAL | 3 refills | Status: DC

## 2022-05-16 MED ORDER — AZITHROMYCIN 250 MG PO TABS: ORAL_TABLET | ORAL | 0 refills | Status: AC

## 2022-05-16 NOTE — Progress Notes (Signed)
I,Anthony Reynolds,acting as a scribe for Anthony Huh, MD.,have documented all relevant documentation on the behalf of Anthony Huh, MD,as directed by  Anthony Huh, MD while in the presence of Anthony Huh, MD.    Annual Wellness Visit     Patient: Anthony Reynolds, Male    DOB: [birth year], 71 y.o.   MRN: [id number] Visit Date: 05/16/2022  Today's Provider: Lelon Huh, MD   Chief Complaint  Patient presents with   Medicare Wellness   Cough   Subjective    Anthony Reynolds is a 71 y.o. male who presents today for his Annual Wellness Visit. He reports consuming a general diet. The patient has a physically strenuous job, but has no regular exercise apart from work.  He generally feels poorly. He reports sleeping Reynolds well. He does have additional problems to discuss today.  Last Colonoscopy: 06/26/2018  HPI Cough: Patient reports having symptoms of productive cough and chest congestion for the past 4 days. He denies fever, body aches, chills or sweats.  Has tried using NyQuil.   Medications: Outpatient Medications Prior to Visit  Medication Sig Note   tadalafil (CIALIS) 20 MG tablet TAKE ONE TABLET BY MOUTH NOT MORE THAN ONCE DAILY AS NEEDED FOR ERECTILE DYSFUNCTION    valsartan-hydrochlorothiazide (DIOVAN-HCT) 80-12.5 MG tablet Take 1 tablet by mouth daily. 05/16/2022: Taking 1/2 tablet daily    No facility-administered medications prior to visit.    No Known Allergies  Patient Care Team: Anthony Sons, MD as PCP - General (Family Medicine) Anthony Reynolds, OD (Optometry) Anthony Reynolds, Anthony Fairly, MD (Urology)  Review of Systems  Constitutional:  Negative for appetite change, chills, fatigue and fever.  HENT:  Positive for congestion. Negative for ear pain, hearing loss, nosebleeds and trouble swallowing.   Eyes:  Negative for pain and visual disturbance.  Respiratory:  Positive for cough. Negative for chest tightness and shortness of breath.   Cardiovascular:   Negative for chest pain, palpitations and leg swelling.  Gastrointestinal:  Negative for abdominal pain, blood in stool, constipation, diarrhea, nausea and vomiting.  Endocrine: Negative for polydipsia, polyphagia and polyuria.  Genitourinary:  Negative for dysuria and flank pain.  Musculoskeletal:  Negative for arthralgias, back pain, joint swelling, myalgias and neck stiffness.  Skin:  Negative for color change, rash and wound.  Neurological:  Negative for dizziness, tremors, seizures, speech difficulty, weakness, light-headedness and headaches.  Psychiatric/Behavioral:  Negative for behavioral problems, confusion, decreased concentration, dysphoric mood and sleep disturbance. The patient is not nervous/anxious.   All other systems reviewed and are negative.       Objective    Vitals: BP 136/78 (BP Location: Left Arm, Patient Position: Sitting, Cuff Size: Large)   Pulse 61   Temp 98.5 F (36.9 C) (Oral)   Resp 20   Ht '5\' 9"'$  (1.753 m)   Wt 197 lb (89.4 kg)   SpO2 100% Comment: room air  BMI 29.09 kg/m     Physical Exam  General Appearance:    Well developed, well nourished male, alert, cooperative, in no acute distress  HENT:   neck without nodes, pharynx erythematous without exudate, frontal sinus tender, and nasal mucosa congested  Eyes:    PERRL, conjunctiva/corneas clear, EOM's intact       Lungs:     Occasional expiratory wheezes, respirations unlabored  Heart:    Normal heart rate. Normal rhythm. No murmurs, rubs, or gallops.    Neurologic:   Awake, alert, oriented x 3. No  apparent focal neurological           defect.        Most recent functional status assessment:    05/16/2022   10:03 AM  In your present state of health, do you have any difficulty performing the following activities:  Hearing? 0  Vision? 0  Difficulty concentrating or making decisions? 0  Walking or climbing stairs? 0  Dressing or bathing? 0  Doing errands, shopping? 0   Most recent fall  risk assessment:    05/16/2022   10:03 AM  Fall Risk   Falls in the past year? 0  Number falls in past yr: 0  Injury with Fall? 0  Risk for fall due to : No Fall Risks  Follow up Falls evaluation completed    Most recent depression screenings:    05/16/2022   10:02 AM 05/15/2021   10:12 AM  PHQ 2/9 Scores  PHQ - 2 Score 0 0  PHQ- 9 Score 0 5   Most recent cognitive screening:     No data to display         Most recent Audit-C alcohol use screening    05/16/2022   10:03 AM  Alcohol Use Disorder Test (AUDIT)  1. How often do you have a drink containing alcohol? 0  2. How many drinks containing alcohol do you have on a typical day when you are drinking? 0  3. How often do you have six or more drinks on one occasion? 0  AUDIT-C Score 0   A score of 3 or more in women, and 4 or more in men indicates increased risk for alcohol abuse, EXCEPT if all of the points are from question 1   No results found for any visits on 05/16/22.  Assessment & Plan     Annual wellness visit done today including the all of the following: Reviewed patient's Family Medical History Reviewed and updated list of patient's medical providers Assessment of cognitive impairment was done Assessed patient's functional ability Established a written schedule for health screening [place] Completed and Reviewed  Exercise Activities and Dietary recommendations  Goals      DIET - REDUCE PORTION SIZE     Recommend to continue current diet plan of eating smaller portions and staying away from junk food or bread.         Immunization History  Administered Date(s) Administered   Fluad Quad(high Dose 65+) 04/28/2019, 05/03/2020, 05/15/2021   Hepatitis A, Adult 04/09/2014, 04/14/2015   Influenza Split 04/04/2012   Influenza, High Dose Seasonal PF 04/17/2017, 04/23/2018   Influenza,inj,Quad PF,6+ Mos 04/08/2013, 04/09/2014, 04/14/2015, 04/16/2016   Janssen (J&J) SARS-COV-2  Vaccination 09/23/2019   Pneumococcal Conjugate-13 04/17/2017   Pneumococcal Polysaccharide-23 04/23/2018   Td 04/23/2018   Tdap 07/08/2007   Zoster, Live 04/04/2012    Health Maintenance  Topic Date Due   Zoster Vaccines- Shingrix (1 of 2) Never done   COVID-19 Vaccine (2 - 2023-24 season) 02/16/2022   Medicare Annual Wellness (AWV)  05/15/2022   INFLUENZA VACCINE  09/16/2022 (Originally 01/16/2022)   COLONOSCOPY (Pts 45-41yr Insurance coverage will need to be confirmed)  06/27/2023   Pneumonia Vaccine 70 Years old  Completed   Hepatitis C Screening  Completed   HPV VACCINES  Aged Out     Discussed health benefits of physical activity, and encouraged him to engage in regular exercise appropriate for his age and condition.      2. Essential hypertension Well controlled.  Continue current medications.   - valsartan (DIOVAN) 40 MG tablet; Take 1 tablet (40 mg total) by mouth daily.  Dispense: 90 tablet; Refill: 3 - hydrochlorothiazide (HYDRODIURIL) 12.5 MG tablet; Take 1 tablet (12.5 mg total) by mouth daily.  Dispense: 90 tablet; Refill: 3  3. Erectile dysfunction, unspecified erectile dysfunction type refill - tadalafil (CIALIS) 20 MG tablet; TAKE ONE TABLET BY MOUTH NOT MORE THAN ONCE DAILY AS NEEDED FOR ERECTILE DYSFUNCTION  Dispense: 90 tablet; Refill: 3  4. Bronchitis - azithromycin (ZITHROMAX) 250 MG tablet; Take 2 tablets on day 1, then 1 tablet daily on days 2 through 5  Dispense: 6 tablet; Refill: 0   Call if symptoms change or if not rapidly improving.    5. Need for influenza vaccination  - Flu Vaccine QUAD High Dose IM (Fluad)  6. Need for COVID-19 vaccine  [employer] Fall 2023 Covid-19 Vaccine 65yr and older    The entirety of the information documented in the History of Present Illness, Review of Systems and Physical Exam were personally obtained by me. Portions of this information were initially documented by the CMA and reviewed by me for thoroughness and  accuracy.     DLelon Huh MD  [phone](phone) [phone](fax)  [place]

## 2022-05-17 LAB — COMPREHENSIVE METABOLIC PANEL
ALT: 10 IU/L (ref 0–44)
AST: 16 IU/L (ref 0–40)
Albumin/Globulin Ratio: 2.2 (ref 1.2–2.2)
Albumin: 4.4 g/dL (ref 3.9–4.9)
Alkaline Phosphatase: 62 IU/L (ref 44–121)
BUN/Creatinine Ratio: 17 (ref 10–24)
BUN: 18 mg/dL (ref 8–27)
Bilirubin Total: 0.6 mg/dL (ref 0.0–1.2)
CO2: 24 mmol/L (ref 20–29)
Calcium: 8.8 mg/dL (ref 8.6–10.2)
Chloride: 103 mmol/L (ref 96–106)
Creatinine, Ser: 1.07 mg/dL (ref 0.76–1.27)
Globulin, Total: 2 g/dL (ref 1.5–4.5)
Glucose: 93 mg/dL (ref 70–99)
Potassium: 4.4 mmol/L (ref 3.5–5.2)
Sodium: 139 mmol/L (ref 134–144)
Total Protein: 6.4 g/dL (ref 6.0–8.5)
eGFR: 75 mL/min/{1.73_m2} (ref >59)

## 2022-05-17 LAB — PSA TOTAL (REFLEX TO FREE): Prostate Specific Ag, Serum: 3.4 ng/mL (ref 0.0–4.0)

## 2022-05-17 LAB — CBC
Hematocrit: 41.8 % (ref 37.5–51.0)
Hemoglobin: 14 g/dL (ref 13.0–17.7)
MCH: 30.5 pg (ref 26.6–33.0)
MCHC: 33.5 g/dL (ref 31.5–35.7)
MCV: 91 fL (ref 79–97)
Platelets: 231 10*3/uL (ref 150–450)
RBC: 4.59 x10E6/uL (ref 4.14–5.80)
RDW: 12.4 % (ref 11.6–15.4)
WBC: 8.8 10*3/uL (ref 3.4–10.8)

## 2022-05-17 LAB — LIPID PANEL
Chol/HDL Ratio: 4.5 ratio (ref 0.0–5.0)
Cholesterol, Total: 174 mg/dL (ref 100–199)
HDL: 39 mg/dL — ABNORMAL LOW (ref >39)
LDL Chol Calc (NIH): 114 mg/dL — ABNORMAL HIGH (ref 0–99)
Triglycerides: 113 mg/dL (ref 0–149)
VLDL Cholesterol Cal: 21 mg/dL (ref 5–40)

## 2022-05-21 ENCOUNTER — Ambulatory Visit: Payer: Self-pay | Admitting: *Deleted

## 2022-05-21 DIAGNOSIS — R051 Acute cough: Secondary | ICD-10-CM

## 2022-05-21 DIAGNOSIS — J40 Bronchitis, not specified as acute or chronic: Secondary | ICD-10-CM

## 2022-05-21 NOTE — Telephone Encounter (Signed)
Please advise 

## 2022-05-21 NOTE — Telephone Encounter (Signed)
Summary: congestion   Patient called in states is still congested after taking the last of antibiotics today, and he wants to know what else he can do, or if Dr Caryn Section wants to put him on more antibiotics. He had an appt already on 11/29         Chief Complaint: still coughing and nasal congestion. Completed course of antibiotics yesterday . Does patient need additional medication? Symptoms: nasal congestion cough productive white to yellow sputum. No wheezing.  Frequency: since yesterday , cough x 1 week  Pertinent Negatives: Patient denies chest pain no difficulty breathing, no fever, no wheezing.  Disposition: '[]'$ ED /'[]'$ Urgent Care (no appt availability in office) / '[]'$ Appointment(In office/virtual)/ '[]'$  [place] [facility]/ '[]'$ Home Care/ '[]'$ Refused Recommended Disposition /'[]'$ [place] [facility]/ '[x]'$  Follow-up with PCP Additional Notes:   Last seen 05/16/22. Completed course of antibiotics yesterday. Sx continue. Do you want to give more antibiotics?         Reason for Disposition  Cough has been present for > 3 weeks    Cough present x 1 week  Answer Assessment - Initial Assessment Questions 1. ONSET: "When did the cough begin?"      Has been seen for cough  2. SEVERITY: "How bad is the cough today?"      Still coughing up phlegm white  to yellow 3. SPUTUM: "Describe the color of your sputum" (none, dry cough; clear, white, yellow, green)     White and yellow  4. HEMOPTYSIS: "Are you coughing up any blood?" If so ask: "How much?" (flecks, streaks, tablespoons, etc.)     na 5. DIFFICULTY BREATHING: "Are you having difficulty breathing?" If Yes, ask: "How bad is it?" (e.g., mild, moderate, severe)    - MILD: No SOB at rest, mild SOB with walking, speaks normally in sentences, can lie down, no retractions, pulse < 100.    - MODERATE: SOB at rest, SOB with minimal exertion and prefers to sit, cannot lie down flat, speaks in phrases, mild retractions, audible wheezing, pulse  100-120.    - SEVERE: Very SOB at rest, speaks in single words, struggling to breathe, sitting hunched forward, retractions, pulse > 120      No issues reported  6. FEVER: "Do you have a fever?" If Yes, ask: "What is your temperature, how was it measured, and when did it start?"     na 7. CARDIAC HISTORY: "Do you have any history of heart disease?" (e.g., heart attack, congestive heart failure)      See hx 8. LUNG HISTORY: "Do you have any history of lung disease?"  (e.g., pulmonary embolus, asthma, emphysema)     Na  9. PE RISK FACTORS: "Do you have a history of blood clots?" (or: recent major surgery, recent prolonged travel, bedridden)     na 10. OTHER SYMPTOMS: "Do you have any other symptoms?" (e.g., runny nose, wheezing, chest pain)       Stuffy nose, chest congestion. Coughing up white to yellow sputum at times. No wheezing. Can breath through nose  11. PREGNANCY: "Is there any chance you are pregnant?" "When was your last menstrual period?"       na 12. TRAVEL: "Have you traveled out of the country in the last month?" (e.g., travel history, exposures)       na  Protocols used: Cough - Acute Productive-A-AH

## 2022-05-22 MED ORDER — BENZONATATE 200 MG PO CAPS: 200.0000 mg | ORAL_CAPSULE | Freq: Two times a day (BID) | ORAL | 0 refills | Status: DC | PRN

## 2022-05-22 MED ORDER — DOXYCYCLINE HYCLATE 100 MG PO TABS: 100.0000 mg | ORAL_TABLET | Freq: Two times a day (BID) | ORAL | 0 refills | Status: AC

## 2022-05-22 NOTE — Addendum Note (Signed)
Addended by: Lelon Huh E on: 05/22/2022 01:00 PM   Modules accepted: Orders

## 2022-05-22 NOTE — Telephone Encounter (Signed)
Patient would like the antibiotic changed and would also like something for his cough. Please advise?

## 2022-05-22 NOTE — Telephone Encounter (Signed)
The azithromycin stays in system for 4-5 days after finishing it. If he is improved then would just give things a few more days. If not improved then would recommend change to different antibiotic.

## 2022-08-13 ENCOUNTER — Other Ambulatory Visit: Payer: Self-pay | Admitting: Family Medicine

## 2022-08-13 DIAGNOSIS — N529 Male erectile dysfunction, unspecified: Secondary | ICD-10-CM

## 2022-08-14 NOTE — Telephone Encounter (Signed)
Rx 05/16/22 #90 3RF- 1 year supply Requested Prescriptions  Pending Prescriptions Disp Refills   tadalafil (CIALIS) 20 MG tablet [Pharmacy Med Name: TADALAFIL 20 MG TABLET] 50 tablet     Sig: TAKE ONE TABLET BY MOUTH DAILY AS NEEDED FOR ERECTILE DYSFUNCTION     Urology: Erectile Dysfunction Agents Passed - 08/13/2022 10:33 AM      Passed - AST in normal range and within 360 days    AST  Date Value Ref Range Status  05/16/2022 16 0 - 40 IU/L Final         Passed - ALT in normal range and within 360 days    ALT  Date Value Ref Range Status  05/16/2022 10 0 - 44 IU/L Final         Passed - Last BP in normal range    BP Readings from Last 1 Encounters:  05/16/22 136/78         Passed - Valid encounter within last 12 months    Recent Outpatient Visits           3 months ago Medicare annual wellness visit, subsequent   [place], Donald E, MD   6 months ago Essential hypertension   [place], [place], [place]   11 months ago Essential hypertension   [place], Donald E, MD   12 months ago Essential hypertension   [place], Donald E, MD   1 year ago Medicare annual wellness visit, subsequent   [facility] Birdie Sons, MD       Future Appointments             In 1 week Ralene Bathe, MD [place]   In 9 months Fisher, Kirstie Peri, MD [facility]

## 2022-08-23 ENCOUNTER — Ambulatory Visit: Payer: Medicare HMO | Admitting: Dermatology

## 2022-08-23 VITALS — BP 141/88

## 2022-08-23 DIAGNOSIS — D1801 Hemangioma of skin and subcutaneous tissue: Secondary | ICD-10-CM | POA: Diagnosis not present

## 2022-08-23 DIAGNOSIS — L814 Other melanin hyperpigmentation: Secondary | ICD-10-CM

## 2022-08-23 DIAGNOSIS — D229 Melanocytic nevi, unspecified: Secondary | ICD-10-CM

## 2022-08-23 DIAGNOSIS — L821 Other seborrheic keratosis: Secondary | ICD-10-CM

## 2022-08-23 DIAGNOSIS — Z1283 Encounter for screening for malignant neoplasm of skin: Secondary | ICD-10-CM

## 2022-08-23 DIAGNOSIS — B353 Tinea pedis: Secondary | ICD-10-CM

## 2022-08-23 DIAGNOSIS — L578 Other skin changes due to chronic exposure to nonionizing radiation: Secondary | ICD-10-CM | POA: Diagnosis not present

## 2022-08-23 DIAGNOSIS — Z79899 Other long term (current) drug therapy: Secondary | ICD-10-CM

## 2022-08-23 DIAGNOSIS — L719 Rosacea, unspecified: Secondary | ICD-10-CM

## 2022-08-23 MED ORDER — TERBINAFINE HCL 250 MG PO TABS: 250.0000 mg | ORAL_TABLET | Freq: Every day | ORAL | 0 refills | Status: DC

## 2022-08-23 MED ORDER — DOXYCYCLINE HYCLATE 20 MG PO TABS: 20.0000 mg | ORAL_TABLET | Freq: Every evening | ORAL | 6 refills | Status: DC

## 2022-08-23 NOTE — Progress Notes (Signed)
New Patient Visit  Subjective  Anthony Reynolds is a 72 y.o. male who presents for the following: Other (New patient - No history of skin cancer or abnormal moles.The patient presents for Total-Body Skin Exam (TBSE) for skin cancer screening and mole check.  The patient has spots, moles and lesions to be evaluated, some may be new or changing and the patient has concerns that these could be cancer./).  The following portions of the chart were reviewed this encounter and updated as appropriate:   Tobacco  Allergies  Meds  Problems  Med Hx  Surg Hx  Fam Hx     Review of Systems:  No other skin or systemic complaints except as noted in HPI or Assessment and Plan.  Objective  Well appearing patient in no apparent distress; mood and affect are within normal limits.  A full examination was performed including scalp, head, eyes, ears, nose, lips, neck, chest, axillae, abdomen, back, buttocks, bilateral upper extremities, bilateral lower extremities, hands, feet, fingers, toes, fingernails, and toenails. All findings within normal limits unless otherwise noted below.  Face Erythema of cheeks and sclera  Anterior Scrotum Red papules  Bilateral feet Toenail dystrophy and scale   Assessment & Plan   Lentigines - Scattered tan macules - Due to sun exposure - Benign-appearing, observe - Recommend daily broad spectrum sunscreen SPF 30+ to sun-exposed areas, reapply every 2 hours as needed. - Call for any changes  Seborrheic Keratoses - Stuck-on, waxy, tan-brown papules and/or plaques  - Benign-appearing - Discussed benign etiology and prognosis. - Observe - Call for any changes  Melanocytic Nevi - Tan-brown and/or pink-flesh-colored symmetric macules and papules - Benign appearing on exam today - Observation - Call clinic for new or changing moles - Recommend daily use of broad spectrum spf 30+ sunscreen to sun-exposed areas.   Hemangiomas - Red papules - Discussed benign  nature - Observe - Call for any changes  Actinic Damage - Chronic condition, secondary to cumulative UV/sun exposure - diffuse scaly erythematous macules with underlying dyspigmentation - Recommend daily broad spectrum sunscreen SPF 30+ to sun-exposed areas, reapply every 2 hours as needed.  - Staying in the shade or wearing long sleeves, sun glasses (UVA+UVB protection) and wide brim hats (4-inch brim around the entire circumference of the hat) are also recommended for sun protection.  - Call for new or changing lesions.  Skin cancer screening performed today.  Rosacea Face Rosacea with Ocular Rosacea Rosacea is a chronic progressive skin condition usually affecting the face of adults, causing redness and/or acne bumps. It is treatable but not curable. It sometimes affects the eyes (ocular rosacea) as well. It may respond to topical and/or systemic medication and can flare with stress, sun exposure, alcohol, exercise, topical steroids (including hydrocortisone/cortisone 10) and some foods.  Daily application of broad spectrum spf 30+ sunscreen to face is recommended to reduce flares.  Discussed condition and treatment options. Advised patient that ocular rosacea can make him more susceptible to styes.  Start Doxycyline 20 mg 1 po qpm with food and plenty of fluid - may increase dose to 40 mg daily if not improving. May continue Metronidazole 1% gel qd if he feels it helps condition. Advised patient to wait to start until he is finished with terbinafine treatment.  doxycycline (PERIOSTAT) 20 MG tablet - Face Take 1 tablet (20 mg total) by mouth every evening. With food and plenty of fluid  Hemangioma of skin Anterior Scrotum Benign-appearing.  Observation.  Call clinic  for new or changing lesions.  Recommend daily use of broad spectrum spf 30+ sunscreen to sun-exposed areas.   Tinea pedis of both feet Bilateral feet Chronic and persistent condition with duration or expected duration  over one year. Condition is symptomatic / bothersome to patient. Not to goal. Reviewed LFTs from 05/16/2022 - WNL  Start Terbinafine 250 mg 1 po qd   terbinafine (LAMISIL) 250 MG tablet - Bilateral feet Take 1 tablet (250 mg total) by mouth daily.  Return in about 2 months (around 10/23/2022).  I, Ashok Cordia, CMA, am acting as scribe for Sarina Ser, MD . Documentation: I have reviewed the above documentation for accuracy and completeness, and I agree with the above.  Sarina Ser, MD

## 2022-08-23 NOTE — Patient Instructions (Addendum)
Rosacea is a chronic progressive skin condition usually affecting the face of adults, causing redness and/or acne bumps. It is treatable but not curable. It sometimes affects the eyes (ocular rosacea) as well. It may respond to topical and/or systemic medication and can flare with stress, sun exposure, alcohol, exercise, topical steroids (including hydrocortisone/cortisone 10) and some foods.  Daily application of broad spectrum spf 30+ sunscreen to face is recommended to reduce flares.   Doxycycline should be taken with food to prevent nausea. Do not lay down for 30 minutes after taking. Be cautious with sun exposure and use good sun protection while on this medication. Pregnant women should not take this medication.    Due to recent changes in healthcare laws, you may see results of your pathology and/or laboratory studies on MyChart before the doctors have had a chance to review them. We understand that in some cases there may be results that are confusing or concerning to you. Please understand that not all results are received at the same time and often the doctors may need to interpret multiple results in order to provide you with the best plan of care or course of treatment. Therefore, we ask that you please give [place] 2 business days to thoroughly review all your results before contacting the office for clarification. Should we see a critical lab result, you will be contacted sooner.   If You Need Anything After Your Visit  If you have any questions or concerns for your doctor, please call our main line at [phone] and press option 4 to reach your doctor's medical assistant. If no one answers, please leave a voicemail as directed and we will return your call as soon as possible. Messages left after 4 pm will be answered the following business day.   You may also send [place] a message via [place]. We typically respond to MyChart messages within 1-2 business days.  For prescription refills, please  ask your pharmacy to contact our office. Our fax number is [phone].  If you have an urgent issue when the clinic is closed that cannot wait until the next business day, you can page your doctor at the number below.    Please note that while we do our best to be available for urgent issues outside of office hours, we are not available 24/7.   If you have an urgent issue and are unable to reach [place], you may choose to seek medical care at your doctor's office, retail clinic, urgent care center, or emergency room.  If you have a medical emergency, please immediately call 911 or go to the emergency department.  Pager Numbers  - Dr. Nehemiah Massed: [phone]  - Dr. Laurence Ferrari: [phone]  - Dr. Nicole Kindred: [phone]  In the event of inclement weather, please call our main line at [phone] for an update on the status of any delays or closures.  Dermatology Medication Tips: Please keep the boxes that topical medications come in in order to help keep track of the instructions about where and how to use these. Pharmacies typically print the medication instructions only on the boxes and not directly on the medication tubes.   If your medication is too expensive, please contact our office at [phone] option 4 or send [place] a message through [place].   We are unable to tell what your co-pay for medications will be in advance as this is different depending on your insurance coverage. However, we may be able to find a substitute medication at lower cost or  fill out paperwork to get insurance to cover a needed medication.   If a prior authorization is required to get your medication covered by your insurance company, please allow [place] 1-2 business days to complete this process.  Drug prices often vary depending on where the prescription is filled and some pharmacies may offer cheaper prices.  The website www.goodrx.com contains coupons for medications through different pharmacies. The prices here do  not account for what the cost may be with help from insurance (it may be cheaper with your insurance), but the website can give you the price if you did not use any insurance.  - You can print the associated coupon and take it with your prescription to the pharmacy.  - You may also stop by our office during regular business hours and pick up a GoodRx coupon card.  - If you need your prescription sent electronically to a different pharmacy, notify our office through [facility] or by phone at [phone] option 4.     Si Usted Necesita Algo Despus de Su Visita  Tambin puede enviarnos un mensaje a travs de Pharmacist, community. Por lo general respondemos a los mensajes de MyChart en el transcurso de 1 a 2 das hbiles.  Para renovar recetas, por favor pida a su farmacia que se ponga en contacto con nuestra oficina. Harland Dingwall de fax es [place] [phone].  Si tiene un asunto urgente cuando la clnica est cerrada y que no puede esperar hasta el siguiente da hbil, puede llamar/localizar a su doctor(a) al nmero que aparece a continuacin.   Por favor, tenga en cuenta que aunque hacemos todo lo posible para estar disponibles para asuntos urgentes fuera del horario de [place], no estamos disponibles las 24 horas del da, los 7 das de la [place].   Si tiene un problema urgente y no puede comunicarse con nosotros, puede optar por buscar atencin mdica  en el consultorio de su doctor(a), en una clnica privada, en un centro de atencin urgente o en una sala de emergencias.  Si tiene Engineering geologist, por favor llame inmediatamente al 911 o vaya a la sala de emergencias.  Nmeros de bper  - Dr. Nehemiah Massed: [phone]  - Dra. Moye: [phone]  - Dra. Nicole Kindred: [phone]  En caso de inclemencias del [place], por favor llame a Johnsie Kindred principal al [phone] para una actualizacin sobre el [place] de cualquier retraso o cierre.  Consejos para la medicacin en dermatologa: Por  favor, guarde las cajas en las que vienen los medicamentos de uso tpico para ayudarle a seguir las instrucciones sobre dnde y cmo usarlos. Las farmacias generalmente imprimen las instrucciones del medicamento slo en las cajas y no directamente en los tubos del [place].   Si su medicamento es muy caro, por favor, pngase en contacto con Zigmund Daniel llamando al [phone] y presione la opcin 4 o envenos un mensaje a travs de Pharmacist, community.   No podemos decirle cul ser su copago por los medicamentos por adelantado ya que esto es diferente dependiendo de la cobertura de su seguro. Sin embargo, es posible que podamos encontrar un medicamento sustituto a Electrical engineer un formulario para que el seguro cubra el medicamento que se considera necesario.   Si se requiere una autorizacin previa para que su compaa de seguros [place] su medicamento, por favor permtanos de 1 a 2 das hbiles para completar este proceso.  Los precios de los medicamentos varan con frecuencia dependiendo del Environmental consultant de dnde se surte la receta  y [place] farmacias pueden ofrecer precios ms baratos.  El sitio web www.goodrx.com tiene cupones para medicamentos de Airline pilot. Los precios aqu no tienen en cuenta lo que podra costar con la ayuda del seguro (puede ser ms barato con su seguro), pero el sitio web puede darle el precio si no utiliz Research scientist (physical sciences).  - Puede imprimir el cupn correspondiente y llevarlo con su receta a la farmacia.  - Tambin puede pasar por nuestra oficina durante el horario de atencin regular y Charity fundraiser una tarjeta de cupones de GoodRx.  - Si necesita que su receta se enve electrnicamente a una farmacia diferente, informe a nuestra oficina a travs de MyChart de [place] o por telfono llamando al [phone] y presione la opcin 4.

## 2022-08-26 ENCOUNTER — Encounter: Payer: Self-pay | Admitting: Dermatology

## 2022-10-10 ENCOUNTER — Telehealth: Payer: Self-pay | Admitting: Family Medicine

## 2022-10-10 NOTE — Telephone Encounter (Signed)
Medication was previously discontinued due to edema.

## 2022-10-10 NOTE — Telephone Encounter (Signed)
[facility] requesting prescription refill Amlodipine Besylate 5 mg tab Please advise

## 2022-10-12 ENCOUNTER — Telehealth: Payer: Self-pay | Admitting: Family Medicine

## 2022-10-12 NOTE — Telephone Encounter (Signed)
[facility] is requesting prescription refill Amlodipine Besylate 5 mg tab Please advise

## 2022-10-15 ENCOUNTER — Other Ambulatory Visit: Payer: Self-pay

## 2022-10-25 ENCOUNTER — Ambulatory Visit: Payer: Medicare HMO | Admitting: Dermatology

## 2022-10-25 ENCOUNTER — Encounter: Payer: Self-pay | Admitting: Dermatology

## 2022-10-25 VITALS — BP 128/83 | HR 59

## 2022-10-25 DIAGNOSIS — L719 Rosacea, unspecified: Secondary | ICD-10-CM

## 2022-10-25 DIAGNOSIS — B353 Tinea pedis: Secondary | ICD-10-CM | POA: Diagnosis not present

## 2022-10-25 DIAGNOSIS — W908XXA Exposure to other nonionizing radiation, initial encounter: Secondary | ICD-10-CM

## 2022-10-25 DIAGNOSIS — X32XXXA Exposure to sunlight, initial encounter: Secondary | ICD-10-CM

## 2022-10-25 DIAGNOSIS — L718 Other rosacea: Secondary | ICD-10-CM | POA: Diagnosis not present

## 2022-10-25 DIAGNOSIS — Z79899 Other long term (current) drug therapy: Secondary | ICD-10-CM

## 2022-10-25 DIAGNOSIS — L578 Other skin changes due to chronic exposure to nonionizing radiation: Secondary | ICD-10-CM

## 2022-10-25 DIAGNOSIS — Z7189 Other specified counseling: Secondary | ICD-10-CM | POA: Diagnosis not present

## 2022-10-25 MED ORDER — TERBINAFINE HCL 250 MG PO TABS
250.0000 mg | ORAL_TABLET | Freq: Every day | ORAL | 1 refills | Status: DC
Start: 2022-10-25 — End: 2023-05-22

## 2022-10-25 NOTE — Patient Instructions (Addendum)
Continue Terbinafine 250 mg tab take 1 tab by mouth daily for 2 more months.    Terbinafine Counseling  Terbinafine is an anti-fungal medicine that can be applied to the skin (over the counter) or taken by mouth (prescription) to treat fungal infections. The pill version is often used to treat fungal infections of the nails or scalp. While most people do not have any side effects from taking terbinafine pills, some possible side effects of the medicine can include taste changes, headache, loss of smell, vision changes, nausea, vomiting, or diarrhea.   Rare side effects can include irritation of the liver, allergic reaction, or decrease in blood counts (which may show up as not feeling well or developing an infection). If you are concerned about any of these side effects, please stop the medicine and call your doctor, or in the case of an emergency such as feeling very unwell, seek immediate medical care.    For Rosacea  After 2 months   Can start doxycycline 20 mg by mouth daily with evening meal      Due to recent changes in healthcare laws, you may see results of your pathology and/or laboratory studies on MyChart before the doctors have had a chance to review them. We understand that in some cases there may be results that are confusing or concerning to you. Please understand that not all results are received at the same time and often the doctors may need to interpret multiple results in order to provide you with the best plan of care or course of treatment. Therefore, we ask that you please give [place] 2 business days to thoroughly review all your results before contacting the office for clarification. Should we see a critical lab result, you will be contacted sooner.   If You Need Anything After Your Visit  If you have any questions or concerns for your doctor, please call our main line at [phone] and press option 4 to reach your doctor's medical assistant. If no one answers, please  leave a voicemail as directed and we will return your call as soon as possible. Messages left after 4 pm will be answered the following business day.   You may also send [place] a message via MyChart. We typically respond to MyChart messages within 1-2 business days.  For prescription refills, please ask your pharmacy to contact our office. Our fax number is [phone].  If you have an urgent issue when the clinic is closed that cannot wait until the next business day, you can page your doctor at the number below.    Please note that while we do our best to be available for urgent issues outside of office hours, we are not available 24/7.   If you have an urgent issue and are unable to reach [place], you may choose to seek medical care at your doctor's office, retail clinic, urgent care center, or emergency room.  If you have a medical emergency, please immediately call 911 or go to the emergency department.  Pager Numbers  - Dr. Gwen Pounds: [phone]  - Dr. Neale Burly: [phone]  - Dr. Roseanne Reno: [phone]  In the event of inclement weather, please call our main line at [phone] for an update on the status of any delays or closures.  Dermatology Medication Tips: Please keep the boxes that topical medications come in in order to help keep track of the instructions about where and how to use these. Pharmacies typically print the medication instructions only on the boxes and not  directly on the medication tubes.   If your medication is too expensive, please contact our office at [phone] option 4 or send [place] a message through MyChart.   We are unable to tell what your co-pay for medications will be in advance as this is different depending on your insurance coverage. However, we may be able to find a substitute medication at lower cost or fill out paperwork to get insurance to cover a needed medication.   If a prior authorization is required to get your medication covered by your insurance  company, please allow [place] 1-2 business days to complete this process.  Drug prices often vary depending on where the prescription is filled and some pharmacies may offer cheaper prices.  The website www.goodrx.com contains coupons for medications through different pharmacies. The prices here do not account for what the cost may be with help from insurance (it may be cheaper with your insurance), but the website can give you the price if you did not use any insurance.  - You can print the associated coupon and take it with your prescription to the pharmacy.  - You may also stop by our office during regular business hours and pick up a GoodRx coupon card.  - If you need your prescription sent electronically to a different pharmacy, notify our office through [facility] or by phone at [phone] option 4.     Si Usted Necesita Algo Despus de Su Visita  Tambin puede enviarnos un mensaje a travs de Clinical cytogeneticist. Por lo general respondemos a los mensajes de MyChart en el transcurso de 1 a 2 das hbiles.  Para renovar recetas, por favor pida a su farmacia que se ponga en contacto con nuestra oficina. Annie Sable de fax es [place] [phone].  Si tiene un asunto urgente cuando la clnica est cerrada y que no puede esperar hasta el siguiente da hbil, puede llamar/localizar a su doctor(a) al nmero que aparece a continuacin.   Por favor, tenga en cuenta que aunque hacemos todo lo posible para estar disponibles para asuntos urgentes fuera del horario de [place], no estamos disponibles las 24 horas del da, los 7 [address]  [address] de la [place].   Si tiene un problema urgente y no puede comunicarse con nosotros, puede optar por buscar atencin mdica  en el consultorio de su doctor(a), en una clnica privada, en un centro de atencin urgente o en una sala de emergencias.  Si tiene Engineer, drilling, por favor llame inmediatamente al 911 o vaya a la sala de emergencias.  Nmeros de bper  - Dr.  Gwen Pounds: [phone]  - Dra. Moye: [phone]  - Dra. Roseanne Reno: [phone]  En caso de inclemencias del [place], por favor llame a Lacy Duverney principal al [phone] para una actualizacin sobre el [place] de cualquier retraso o cierre.  Consejos para la medicacin en dermatologa: Por favor, guarde las cajas en las que vienen los medicamentos de uso tpico para ayudarle a seguir las instrucciones sobre dnde y cmo usarlos. Las farmacias generalmente imprimen las instrucciones del medicamento slo en las cajas y no directamente en los tubos del [place].   Si su medicamento es muy caro, por favor, pngase en contacto con Rolm Gala llamando al [phone] y presione la opcin 4 o envenos un mensaje a travs de Clinical cytogeneticist.   No podemos decirle cul ser su copago por los medicamentos por adelantado ya que esto es diferente dependiendo de la cobertura de su seguro. Sin embargo, es posible que podamos encontrar un medicamento  sustituto a Audiological scientist un formulario para que el seguro cubra el medicamento que se considera necesario.   Si se requiere una autorizacin previa para que su compaa de seguros [place] su medicamento, por favor permtanos de 1 a 2 das hbiles para completar [address]  Los precios de los medicamentos varan con frecuencia dependiendo del Environmental consultant de dnde se surte la receta y alguna farmacias pueden ofrecer precios ms baratos.  El sitio web www.goodrx.com tiene cupones para medicamentos de Health and safety inspector. Los precios aqu no tienen en cuenta lo que podra costar con la ayuda del seguro (puede ser ms barato con su seguro), pero el sitio web puede darle el precio si no utiliz Tourist information centre manager.  - Puede imprimir el cupn correspondiente y llevarlo con su receta a la farmacia.  - Tambin puede pasar por nuestra oficina durante el horario de atencin regular y Education officer, museum una tarjeta de cupones de GoodRx.  - Si necesita que su receta se enve  electrnicamente a una farmacia diferente, informe a nuestra oficina a travs de MyChart de [place] o por telfono llamando al [phone] y presione la opcin 4.

## 2022-10-25 NOTE — Progress Notes (Signed)
Follow-Up Visit   Subjective  Anthony Reynolds is a 72 y.o. male who presents for the following: recheck bilateral feet and for rosacea and other spots.  The following portions of the chart were reviewed this encounter and updated as appropriate: medications, allergies, medical history  Review of Systems:  No other skin or systemic complaints except as noted in HPI or Assessment and Plan.  Objective  Well appearing patient in no apparent distress; mood and affect are within normal limits. A focused examination was performed of the following areas: Feet  Relevant exam findings are noted in the Assessment and Plan.   Assessment & Plan   Rosacea Face Rosacea with Ocular Rosacea Rosacea is a chronic progressive skin condition usually affecting the face of adults, causing redness and/or acne bumps. It is treatable but not curable. It sometimes affects the eyes (ocular rosacea) as well. It may respond to topical and/or systemic medication and can flare with stress, sun exposure, alcohol, exercise, topical steroids (including hydrocortisone/cortisone 10) and some foods.  Daily application of broad spectrum spf 30+ sunscreen to face is recommended to reduce flares.   Discussed condition and treatment options. Advised patient that ocular rosacea can make him more susceptible to styes.  Discussed can start after 2 months  Start Doxycyline 20 mg 1 po qpm with food and plenty of fluid - may increase dose to 40 mg daily if not improving. May continue Metronidazole 1% gel qd if he feels it helps condition. Advised patient to wait to start until he is finished with terbinafine treatment.   Tinea pedis of both feet Bilateral feet  Toenail dystrophy and scale   Chronic and persistent condition with duration or expected duration over one year. Condition is symptomatic / bothersome to patient. Not to goal. Has Improved     Restart Terbinafine 250 mg 1 po qd for another 2 months  Terbinafine  Counseling  Terbinafine is an anti-fungal medicine that can be applied to the skin (over the counter) or taken by mouth (prescription) to treat fungal infections. The pill version is often used to treat fungal infections of the nails or scalp. While most people do not have any side effects from taking terbinafine pills, some possible side effects of the medicine can include taste changes, headache, loss of smell, vision changes, nausea, vomiting, or diarrhea.   Rare side effects can include irritation of the liver, allergic reaction, or decrease in blood counts (which may show up as not feeling well or developing an infection). If you are concerned about any of these side effects, please stop the medicine and call your doctor, or in the case of an emergency such as feeling very unwell, seek immediate medical care.    ACTINIC DAMAGE - chronic, secondary to cumulative UV radiation exposure/sun exposure over time - diffuse scaly erythematous macules with underlying dyspigmentation - Recommend daily broad spectrum sunscreen SPF 30+ to sun-exposed areas, reapply every 2 hours as needed.  - Recommend staying in the shade or wearing long sleeves, sun glasses (UVA+UVB protection) and wide brim hats (4-inch brim around the entire circumference of the hat). - Call for new or changing lesions.   Tinea pedis of both feet  Related Medications terbinafine (LAMISIL) 250 MG tablet Take 1 tablet (250 mg total) by mouth daily.   Return for march tbse .  Geralynn Rile, CMA, am acting as scribe for Armida Sans, MD.  Documentation: I have reviewed the above documentation for accuracy and completeness, and I  agree with the above.  Armida Sans, MD

## 2022-11-03 ENCOUNTER — Encounter: Payer: Self-pay | Admitting: Dermatology

## 2023-03-11 DIAGNOSIS — H353131 Nonexudative age-related macular degeneration, bilateral, early dry stage: Secondary | ICD-10-CM | POA: Diagnosis not present

## 2023-03-11 DIAGNOSIS — H02889 Meibomian gland dysfunction of unspecified eye, unspecified eyelid: Secondary | ICD-10-CM | POA: Diagnosis not present

## 2023-03-11 DIAGNOSIS — H2513 Age-related nuclear cataract, bilateral: Secondary | ICD-10-CM | POA: Diagnosis not present

## 2023-03-11 DIAGNOSIS — H43811 Vitreous degeneration, right eye: Secondary | ICD-10-CM | POA: Diagnosis not present

## 2023-03-25 ENCOUNTER — Encounter: Payer: Self-pay | Admitting: Family Medicine

## 2023-03-25 ENCOUNTER — Ambulatory Visit (INDEPENDENT_AMBULATORY_CARE_PROVIDER_SITE_OTHER): Payer: Medicare HMO | Admitting: Family Medicine

## 2023-03-25 VITALS — BP 116/82 | HR 72 | Ht 69.0 in | Wt 193.9 lb

## 2023-03-25 DIAGNOSIS — R531 Weakness: Secondary | ICD-10-CM | POA: Diagnosis not present

## 2023-03-25 DIAGNOSIS — S20212A Contusion of left front wall of thorax, initial encounter: Secondary | ICD-10-CM

## 2023-03-25 DIAGNOSIS — R0989 Other specified symptoms and signs involving the circulatory and respiratory systems: Secondary | ICD-10-CM | POA: Diagnosis not present

## 2023-03-25 DIAGNOSIS — G245 Blepharospasm: Secondary | ICD-10-CM | POA: Diagnosis not present

## 2023-03-25 NOTE — Progress Notes (Signed)
Established patient visit   Patient: Anthony Reynolds   DOB: 105/17/1952   72 y.o. Male  MRN: [id number] Visit Date: 03/25/2023  Today's healthcare provider: Mila Merry, MD   Chief Complaint  Patient presents with   Medical Management of Chronic Issues    Blood pressure follow up, left chest pain due to cow butting, and left eye twitching that started last week    Foot Swelling    Bilateral feet, may become worse    Subjective    Discussed the use of AI scribe software for clinical note transcription with the patient, who gave verbal consent to proceed.  History of Present Illness   Patient with a history of hypertension presents with a recent drop in blood pressure, associated with dizziness and lightheadedness upon waking. He reports feeling unwell throughout the day when the blood pressure drop occurred. He has been taking Valsartan and Hydrochlorothiazide for hypertension management, which he takes first thing in the morning. He reports a drop in blood pressure around the same time last year, which led to a temporary cessation of his hypertension medication until he could consult with his healthcare provider. His blood pressure typically ranges from 130 to 140, but he has experienced spikes up to 160 to 180 in the past.  The patient also reports experiencing pain on the left side of his chest, which he attributes to a recent incident of being hit by a cow three weeks ago. He denies any immediate discomfort post-incident but has since developed pain in the area.  Additionally, he has been experiencing muscle spasms in the left eye, which started two days after a visit to the eye doctor. He also reports a feeling of weakness in the legs, making it difficult to get up and down.       Medications: Outpatient Medications Prior to Visit  Medication Sig   tadalafil (CIALIS) 20 MG tablet TAKE ONE TABLET BY MOUTH NOT MORE THAN ONCE DAILY AS NEEDED FOR ERECTILE DYSFUNCTION    terbinafine (LAMISIL) 250 MG tablet Take 1 tablet (250 mg total) by mouth daily.   valsartan (DIOVAN) 40 MG tablet Take 1 tablet (40 mg total) by mouth daily.   benzonatate (TESSALON) 200 MG capsule Take 1 capsule (200 mg total) by mouth 2 (two) times daily as needed for cough. (Patient not taking: Reported on 03/25/2023)   doxycycline (PERIOSTAT) 20 MG tablet Take 1 tablet (20 mg total) by mouth every evening. With food and plenty of fluid (Patient not taking: Reported on 03/25/2023)   hydrochlorothiazide (HYDRODIURIL) 12.5 MG tablet Take 1 tablet (12.5 mg total) by mouth daily. (Patient not taking: Reported on 03/25/2023)   No facility-administered medications prior to visit.       Objective    BP 116/82 (BP Location: Left Arm, Patient Position: Sitting, Cuff Size: Large)   Pulse 72   Ht 5\' 9"  (1.753 m)   Wt 193 lb 14.4 oz (88 kg)   SpO2 98%   BMI 28.63 kg/m   Physical Exam   General: Appearance:    Well developed, well nourished male in no acute distress  Eyes:    PERRL, conjunctiva/corneas clear, EOM's intact       Lungs:     Clear to auscultation bilaterally, respirations unlabored  Heart:    Normal heart rate. Normal rhythm. No murmurs, rubs, or gallops.    MS:   All extremities are intact.    Neurologic:   Awake, alert, oriented x  Established patient visit   Patient: Anthony Reynolds   DOB: 101/05/1951   72 y.o. Male  MRN: [id number] Visit Date: 03/25/2023  Today's healthcare provider: Mila Merry, MD   Chief Complaint  Patient presents with   Medical Management of Chronic Issues    Blood pressure follow up, left chest pain due to cow butting, and left eye twitching that started last week    Foot Swelling    Bilateral feet, may become worse    Subjective    Discussed the use of AI scribe software for clinical note transcription with the patient, who gave verbal consent to proceed.  History of Present Illness   Patient with a history of hypertension presents with a recent drop in blood pressure, associated with dizziness and lightheadedness upon waking. He reports feeling unwell throughout the day when the blood pressure drop occurred. He has been taking Valsartan and Hydrochlorothiazide for hypertension management, which he takes first thing in the morning. He reports a drop in blood pressure around the same time last year, which led to a temporary cessation of his hypertension medication until he could consult with his healthcare provider. His blood pressure typically ranges from 130 to 140, but he has experienced spikes up to 160 to 180 in the past.  The patient also reports experiencing pain on the left side of his chest, which he attributes to a recent incident of being hit by a cow three weeks ago. He denies any immediate discomfort post-incident but has since developed pain in the area.  Additionally, he has been experiencing muscle spasms in the left eye, which started two days after a visit to the eye doctor. He also reports a feeling of weakness in the legs, making it difficult to get up and down.       Medications: Outpatient Medications Prior to Visit  Medication Sig   tadalafil (CIALIS) 20 MG tablet TAKE ONE TABLET BY MOUTH NOT MORE THAN ONCE DAILY AS NEEDED FOR ERECTILE DYSFUNCTION    terbinafine (LAMISIL) 250 MG tablet Take 1 tablet (250 mg total) by mouth daily.   valsartan (DIOVAN) 40 MG tablet Take 1 tablet (40 mg total) by mouth daily.   benzonatate (TESSALON) 200 MG capsule Take 1 capsule (200 mg total) by mouth 2 (two) times daily as needed for cough. (Patient not taking: Reported on 03/25/2023)   doxycycline (PERIOSTAT) 20 MG tablet Take 1 tablet (20 mg total) by mouth every evening. With food and plenty of fluid (Patient not taking: Reported on 03/25/2023)   hydrochlorothiazide (HYDRODIURIL) 12.5 MG tablet Take 1 tablet (12.5 mg total) by mouth daily. (Patient not taking: Reported on 03/25/2023)   No facility-administered medications prior to visit.       Objective    BP 116/82 (BP Location: Left Arm, Patient Position: Sitting, Cuff Size: Large)   Pulse 72   Ht 5\' 9"  (1.753 m)   Wt 193 lb 14.4 oz (88 kg)   SpO2 98%   BMI 28.63 kg/m   Physical Exam   General: Appearance:    Well developed, well nourished male in no acute distress  Eyes:    PERRL, conjunctiva/corneas clear, EOM's intact       Lungs:     Clear to auscultation bilaterally, respirations unlabored  Heart:    Normal heart rate. Normal rhythm. No murmurs, rubs, or gallops.    MS:   All extremities are intact.    Neurologic:   Awake, alert, oriented x

## 2023-03-26 LAB — CBC WITH DIFFERENTIAL/PLATELET
Basophils Absolute: 0.1 10*3/uL (ref 0.0–0.2)
Basos: 1 %
EOS (ABSOLUTE): 0.1 10*3/uL (ref 0.0–0.4)
Eos: 1 %
Hematocrit: 42.8 % (ref 37.5–51.0)
Hemoglobin: 14.3 g/dL (ref 13.0–17.7)
Immature Grans (Abs): 0 10*3/uL (ref 0.0–0.1)
Immature Granulocytes: 0 %
Lymphocytes Absolute: 3.6 10*3/uL — ABNORMAL HIGH (ref 0.7–3.1)
Lymphs: 40 %
MCH: 31.6 pg (ref 26.6–33.0)
MCHC: 33.4 g/dL (ref 31.5–35.7)
MCV: 95 fL (ref 79–97)
Monocytes Absolute: 0.5 10*3/uL (ref 0.1–0.9)
Monocytes: 6 %
Neutrophils Absolute: 4.7 10*3/uL (ref 1.4–7.0)
Neutrophils: 52 %
Platelets: 256 10*3/uL (ref 150–450)
RBC: 4.52 x10E6/uL (ref 4.14–5.80)
RDW: 12.7 % (ref 11.6–15.4)
WBC: 9 10*3/uL (ref 3.4–10.8)

## 2023-03-26 LAB — COMPREHENSIVE METABOLIC PANEL
ALT: 6 [IU]/L (ref 0–44)
AST: 12 [IU]/L (ref 0–40)
Albumin: 4.4 g/dL (ref 3.8–4.8)
Alkaline Phosphatase: 72 [IU]/L (ref 44–121)
BUN/Creatinine Ratio: 16 (ref 10–24)
BUN: 20 mg/dL (ref 8–27)
Bilirubin Total: 0.4 mg/dL (ref 0.0–1.2)
CO2: 24 mmol/L (ref 20–29)
Calcium: 9.7 mg/dL (ref 8.6–10.2)
Chloride: 100 mmol/L (ref 96–106)
Creatinine, Ser: 1.26 mg/dL (ref 0.76–1.27)
Globulin, Total: 2.1 g/dL (ref 1.5–4.5)
Glucose: 118 mg/dL — ABNORMAL HIGH (ref 70–99)
Potassium: 4.5 mmol/L (ref 3.5–5.2)
Sodium: 139 mmol/L (ref 134–144)
Total Protein: 6.5 g/dL (ref 6.0–8.5)
eGFR: 61 mL/min/{1.73_m2} (ref >59)

## 2023-03-26 LAB — CK: Total CK: 85 U/L (ref 41–331)

## 2023-03-26 LAB — TSH: TSH: 2.48 u[IU]/mL (ref 0.450–4.500)

## 2023-03-26 LAB — MAGNESIUM: Magnesium: 2.4 mg/dL — ABNORMAL HIGH (ref 1.6–2.3)

## 2023-04-03 ENCOUNTER — Ambulatory Visit (INDEPENDENT_AMBULATORY_CARE_PROVIDER_SITE_OTHER): Payer: Medicare HMO

## 2023-04-03 VITALS — Ht 69.0 in | Wt 193.0 lb

## 2023-04-03 DIAGNOSIS — Z0000 Encounter for general adult medical examination without abnormal findings: Secondary | ICD-10-CM

## 2023-04-03 NOTE — Patient Instructions (Signed)
Mr. Reser , Thank you for taking time to come for your Medicare Wellness Visit. I appreciate your ongoing commitment to your health goals. Please review the following plan we discussed and let me know if I can assist you in the future.   Referrals/Orders/Follow-Ups/Clinician Recommendations: none  This is a list of the screening recommended for you and due dates:  Health Maintenance  Topic Date Due   Zoster (Shingles) Vaccine (1 of 2) 08/08/1970   Flu Shot  01/17/2023   COVID-19 Vaccine (3 - 2023-24 season) 02/17/2023   Colon Cancer Screening  06/27/2023   Medicare Annual Wellness Visit  04/02/2024   DTaP/Tdap/Td vaccine (3 - Td or Tdap) 04/23/2028   Pneumonia Vaccine  Completed   Hepatitis C Screening  Completed   HPV Vaccine  Aged Out    Advanced directives: (Copy Requested) Please bring a copy of your health care power of attorney and living will to the office to be added to your chart at your convenience.  Next Medicare Annual Wellness Visit scheduled for next year: Yes 04/06/24 @ 3pm telephone

## 2023-04-03 NOTE — Progress Notes (Signed)
Subjective:   Anthony Reynolds is a 72 y.o. male who presents for Medicare Annual/Subsequent preventive examination.  Visit Complete: Virtual I connected with  Anthony Reynolds on 04/03/23 by a audio enabled telemedicine application and verified that I am speaking with the correct person using two identifiers.  Patient Location: Home  Provider Location: Office/Clinic  I discussed the limitations of evaluation and management by telemedicine. The patient expressed understanding and agreed to proceed.  Vital Signs: Because this visit was a virtual/telehealth visit, some criteria may be missing or patient reported. Any vitals not documented were not able to be obtained and vitals that have been documented are patient reported.  Patient Medicare AWV questionnaire was completed by the patient on (not done); I have confirmed that all information answered by patient is correct and no changes since this date. Cardiac Risk Factors include: advanced age (>79men, >58 women);hypertension;male gender    Objective:    Today's Vitals   04/03/23 1559 04/03/23 1600  Weight: 193 lb (87.5 kg)   Height: 5\' 9"  (1.753 m)   PainSc:  5    Body mass index is 28.5 kg/m.     04/03/2023    4:10 PM 05/03/2020    1:23 PM 02/05/2020    1:02 PM 04/28/2019   10:08 AM 04/23/2018    2:38 PM 05/24/2017    8:48 AM 05/17/2017    9:38 AM  Advanced Directives  Does Patient Have a Medical Advance Directive? Yes Yes No Yes Yes Yes Yes  Type of Estate agent of [place];Living will Healthcare Power of [place];Living will  Healthcare Power of [place];Living will Living will Healthcare Power of [employer] Power of [place];Living will  Does patient want to make changes to medical advance directive?      No - Patient declined   Copy of Healthcare Power of Attorney in Chart? No - copy requested No - copy requested  No - copy requested   No - copy requested  Would patient like information on  creating a medical advance directive?   No - Patient declined        Current Medications (verified) Outpatient Encounter Medications as of 04/03/2023  Medication Sig   tadalafil (CIALIS) 20 MG tablet TAKE ONE TABLET BY MOUTH NOT MORE THAN ONCE DAILY AS NEEDED FOR ERECTILE DYSFUNCTION   terbinafine (LAMISIL) 250 MG tablet Take 1 tablet (250 mg total) by mouth daily.   valsartan (DIOVAN) 40 MG tablet Take 1 tablet (40 mg total) by mouth daily.   benzonatate (TESSALON) 200 MG capsule Take 1 capsule (200 mg total) by mouth 2 (two) times daily as needed for cough. (Patient not taking: Reported on 03/25/2023)   doxycycline (PERIOSTAT) 20 MG tablet Take 1 tablet (20 mg total) by mouth every evening. With food and plenty of fluid (Patient not taking: Reported on 03/25/2023)   hydrochlorothiazide (HYDRODIURIL) 12.5 MG tablet Take 1 tablet (12.5 mg total) by mouth daily. (Patient not taking: Reported on 03/25/2023)   No facility-administered encounter medications on file as of 04/03/2023.    Allergies (verified) Patient has no known allergies.   History: Past Medical History:  Diagnosis Date   Hypertension    Spermatocele    Past Surgical History:  Procedure Laterality Date   COLONOSCOPY W/ POLYPECTOMY  06/24/13   HERNIA REPAIR     SKIN GRAFT SPLIT THICKNESS LEG / FOOT  01/2020   [facility]   SPERMATOCELECTOMY Right 05/24/2017   Procedure: SPERMATOCELECTOMY;  Surgeon: Riki Altes,  MD;  Location: [facility] ORS;  Service: Urology;  Laterality: Right;   TONSILLECTOMY     Family History  Problem Relation Age of Onset   COPD Mother    Heart attack Father    Diabetes Father    Aneurysm Brother    Prostate cancer Neg Hx    Kidney cancer Neg Hx    Bladder Cancer Neg Hx    Social History   Socioeconomic History   Marital status: Married    Spouse name: Not on file   Number of children: 2   Years of education: Not on file   Highest education level: Some college, no degree  Occupational History    Occupation: Self Employed    Comment: Farming   Occupation: retired  Tobacco Use   Smoking status: Former    Current packs/day: 0.00    Types: Cigarettes    Quit date: 06/18/1973    Years since quitting: 49.8   Smokeless tobacco: Never  Vaping Use   Vaping status: Never Used  Substance and Sexual Activity   Alcohol use: No    Alcohol/week: 0.0 standard drinks of alcohol   Drug use: No   Sexual activity: Not on file  Other Topics Concern   Not on file  Social History Narrative   Not on file   Social Determinants of Health   Financial Resource Strain: Low Risk  (04/03/2023)   Overall Financial Resource Strain (CARDIA)    Difficulty of Paying Living Expenses: Not hard at all  Food Insecurity: No Food Insecurity (04/03/2023)   Hunger Vital Sign    Worried About Running Out of Food in the Last Year: Never true    Ran Out of Food in the Last Year: Never true  Transportation Needs: No Transportation Needs (04/03/2023)   PRAPARE - Administrator, Civil Service (Medical): No    Lack of Transportation (Non-Medical): No  Physical Activity: Insufficiently Active (04/03/2023)   Exercise Vital Sign    Days of Exercise per Week: 3 days    Minutes of Exercise per Session: 30 min  Stress: No Stress Concern Present (04/03/2023)   [employer] of Occupational Health - Occupational Stress Questionnaire    Feeling of Stress : Not at all  Social Connections: Moderately Integrated (04/03/2023)   Social Connection and Isolation Panel [NHANES]    Frequency of Communication with Friends and Family: More than three times a week    Frequency of Social Gatherings with Friends and Family: More than three times a week    Attends Religious Services: Never    Database administrator or Organizations: Yes    Attends Banker Meetings: Never    Marital Status: Married    Tobacco Counseling Counseling given: Not Answered   Clinical Intake:  Pre-visit preparation  completed: Yes  Pain : 0-10 Pain Score: 5  Pain Type: Chronic pain Pain Location: Foot (feet and hands (they swell and "go to sleep") Pain Descriptors / Indicators: Numbness, Squeezing Pain Onset: More than a month ago Pain Frequency: Constant Pain Relieving Factors: nothing  Pain Relieving Factors: nothing  BMI - recorded: 28.5 Nutritional Status: BMI 25 -29 Overweight Nutritional Risks: None Diabetes: No  How often do you need to have someone help you when you read instructions, pamphlets, or other written materials from your doctor or pharmacy?: 1 - Never  Interpreter Needed?: No  Comments: lives with wife Information entered by :: Anthony Lange,Anthony Reynolds   Activities of Daily Living  04/03/2023    4:11 PM 05/16/2022   10:03 AM  In your present state of health, do you have any difficulty performing the following activities:  Hearing? 0 0  Vision? 0 0  Difficulty concentrating or making decisions? 1 0  Comment memory   Walking or climbing stairs? 1 0  Dressing or bathing? 0 0  Doing errands, shopping? 0 0  Preparing Food and eating ? N   Using the Toilet? N   In the past six months, have you accidently leaked urine? N   Do you have problems with loss of bowel control? N   Managing your Medications? N   Managing your Finances? N   Housekeeping or managing your Housekeeping? N     Patient Care Team: Malva Limes, MD as PCP - General (Family Medicine) Isla Pence, OD (Optometry) Lonna Cobb, Verna Czech, MD (Urology)  Indicate any recent Medical Services you may have received from other than [facility] providers in the past year (date may be approximate).     Assessment:   This is a routine wellness examination for Anthony Reynolds.  Hearing/Vision screen Hearing Screening - Comments:: Pt says his hearing is fine Vision Screening - Comments:: Pt says his vision is good Dr Clydene Pugh   Goals Addressed             This Visit's Progress    DIET - REDUCE PORTION SIZE   On  track    Recommend to continue current diet plan of eating smaller portions and staying away from junk food or bread.        Depression Screen    04/03/2023    4:06 PM 03/25/2023   11:23 AM 05/16/2022   10:02 AM 05/15/2021   10:12 AM 05/03/2020    1:22 PM 04/28/2019   10:09 AM 04/23/2018    3:04 PM  PHQ 2/9 Scores  PHQ - 2 Score 0 0 0 0 0 0 0  PHQ- 9 Score  4 0 5   0    Fall Risk    04/03/2023    4:04 PM 05/16/2022   10:03 AM 05/15/2021   10:13 AM 05/03/2020    1:23 PM 05/11/2019    6:07 PM  Fall Risk   Falls in the past year? 0 0 0 0 0  Comment     Emmi Telephone Survey: data to providers prior to load  Number falls in past yr: 0 0 0 0   Injury with Fall? 0 0 0 0   Risk for fall due to : No Fall Risks No Fall Risks No Fall Risks    Follow up Falls prevention discussed;Education provided Falls evaluation completed Falls evaluation completed      MEDICARE RISK AT HOME: Medicare Risk at Home Any stairs in or around the home?: Yes If so, are there any without handrails?: Yes Home free of loose throw rugs in walkways, pet beds, electrical cords, etc?: Yes Adequate lighting in your home to reduce risk of falls?: Yes Life alert?: No Use of a cane, walker or w/c?: No Grab bars in the bathroom?: No Shower chair or bench in shower?: Yes Elevated toilet seat or a handicapped toilet?: No  TIMED UP AND GO:  Was the test performed?  No    Cognitive Function:        04/03/2023    4:13 PM  6CIT Screen  What Year? 0 points  What month? 0 points  What time? 0 points  Count back from 20  0 points  Months in reverse 0 points  Repeat phrase 0 points  Total Score 0 points    Immunizations Immunization History  Administered Date(s) Administered   Fluad Quad(high Dose 65+) 04/28/2019, 05/03/2020, 05/15/2021, 05/16/2022   Hepatitis A, Adult 04/09/2014, 04/14/2015   Influenza Split 04/04/2012   Influenza, High Dose Seasonal PF 04/17/2017, 04/23/2018    Influenza,inj,Quad PF,6+ Mos 04/08/2013, 04/09/2014, 04/14/2015, 04/16/2016   Janssen (J&J) SARS-COV-2 Vaccination 09/23/2019   Pfizer(Comirnaty)Fall Seasonal Vaccine 12 years and older 05/16/2022   Pneumococcal Conjugate-13 04/17/2017   Pneumococcal Polysaccharide-23 04/23/2018   Td 04/23/2018   Tdap 07/08/2007   Zoster, Live 04/04/2012    TDAP status: Up to date  Flu Vaccine status: Due, Education has been provided regarding the importance of this vaccine. Advised may receive this vaccine at local pharmacy or Health Dept. Aware to provide a copy of the vaccination record if obtained from local pharmacy or Health Dept. Verbalized acceptance and understanding.  Pneumococcal vaccine status: Up to date  Covid-19 vaccine status: Completed vaccines  Qualifies for Shingles Vaccine? Yes   Zostavax completed No   Shingrix Completed?: No.    Education has been provided regarding the importance of this vaccine. Patient has been advised to call insurance company to determine out of pocket expense if they have not yet received this vaccine. Advised may also receive vaccine at local pharmacy or Health Dept. Verbalized acceptance and understanding.  Screening Tests Health Maintenance  Topic Date Due   Colonoscopy  06/27/2023   COVID-19 Vaccine (3 - 2023-24 season) 04/19/2023 (Originally 02/17/2023)   Zoster Vaccines- Shingrix (1 of 2) 07/04/2023 (Originally 08/08/1970)   INFLUENZA VACCINE  09/16/2023 (Originally 01/17/2023)   Medicare Annual Wellness (AWV)  04/02/2024   DTaP/Tdap/Td (3 - Td or Tdap) 04/23/2028   Pneumonia Vaccine 54+ Years old  Completed   Hepatitis C Screening  Completed   HPV VACCINES  Aged Out    Health Maintenance  Health Maintenance Due  Topic Date Due   Colonoscopy  06/27/2023    Colorectal cancer screening: Type of screening: Colonoscopy. Completed 06/26/2018. Repeat every 5 years  Lung Cancer Screening: (Low Dose CT Chest recommended if Age 27-80 years, 20 pack-year  currently smoking OR have quit w/in 15years.) does not qualify.   Lung Cancer Screening Referral: no  Additional Screening:  Hepatitis C Screening: does not qualify; Completed 04/07/2013  Vision Screening: Recommended annual ophthalmology exams for early detection of glaucoma and other disorders of the eye. Is the patient up to date with their annual eye exam?  Yes  Who is the provider or what is the name of the office in which the patient attends annual eye exams? Dr Clydene Pugh If pt is not established with a provider, would they like to be referred to a provider to establish care? No .   Dental Screening: Recommended annual dental exams for proper oral hygiene  Diabetic Foot Exam: n/a  Community Resource Referral / Chronic Care Management: CRR required this visit?  No   CCM required this visit?  No    Plan:     I have personally reviewed and noted the following in the patient's chart:   Medical and social history Use of alcohol, tobacco or illicit drugs  Current medications and supplements including opioid prescriptions. Patient is not currently taking opioid prescriptions. Functional ability and status Nutritional status Physical activity Advanced directives List of other physicians Hospitalizations, surgeries, and ER visits in previous 12 months Vitals Screenings to include cognitive, depression, and falls  Referrals and appointments  In addition, I have reviewed and discussed with patient certain preventive protocols, quality metrics, and best practice recommendations. A written personalized care plan for preventive services as well as general preventive health recommendations were provided to patient.    Anthony Lush, Anthony Reynolds   16/03/9603   After Visit Summary: (MyChart) Due to this being a telephonic visit, the after visit summary with patients personalized plan was offered to patient via MyChart   Nurse Notes: The patient states they are doing his normal routine  activities though dealing with feet and hand pain. He has no concerns or questions at this time.

## 2023-04-22 DIAGNOSIS — H524 Presbyopia: Secondary | ICD-10-CM | POA: Diagnosis not present

## 2023-05-05 ENCOUNTER — Other Ambulatory Visit: Payer: Self-pay | Admitting: Family Medicine

## 2023-05-05 DIAGNOSIS — I10 Essential (primary) hypertension: Secondary | ICD-10-CM

## 2023-05-22 ENCOUNTER — Ambulatory Visit: Payer: Medicare HMO | Admitting: Family Medicine

## 2023-05-22 ENCOUNTER — Encounter: Payer: Self-pay | Admitting: Family Medicine

## 2023-05-22 VITALS — BP 114/66 | HR 59 | Ht 68.0 in | Wt 190.6 lb

## 2023-05-22 DIAGNOSIS — Z125 Encounter for screening for malignant neoplasm of prostate: Secondary | ICD-10-CM

## 2023-05-22 DIAGNOSIS — Z8249 Family history of ischemic heart disease and other diseases of the circulatory system: Secondary | ICD-10-CM

## 2023-05-22 DIAGNOSIS — Z23 Encounter for immunization: Secondary | ICD-10-CM

## 2023-05-22 DIAGNOSIS — Z0000 Encounter for general adult medical examination without abnormal findings: Secondary | ICD-10-CM | POA: Diagnosis not present

## 2023-05-22 DIAGNOSIS — Z860100 Personal history of colon polyps, unspecified: Secondary | ICD-10-CM | POA: Diagnosis not present

## 2023-05-22 DIAGNOSIS — N529 Male erectile dysfunction, unspecified: Secondary | ICD-10-CM

## 2023-05-22 DIAGNOSIS — I10 Essential (primary) hypertension: Secondary | ICD-10-CM | POA: Diagnosis not present

## 2023-05-22 DIAGNOSIS — M792 Neuralgia and neuritis, unspecified: Secondary | ICD-10-CM | POA: Diagnosis not present

## 2023-05-22 DIAGNOSIS — Z1211 Encounter for screening for malignant neoplasm of colon: Secondary | ICD-10-CM

## 2023-05-22 MED ORDER — PREGABALIN 50 MG PO CAPS: 50.0000 mg | ORAL_CAPSULE | Freq: Two times a day (BID) | ORAL | 1 refills | Status: DC

## 2023-05-22 MED ORDER — TADALAFIL 20 MG PO TABS: ORAL_TABLET | ORAL | 3 refills | Status: AC

## 2023-05-22 NOTE — Patient Instructions (Signed)
Health Maintenance, Male Adopting a healthy lifestyle and getting preventive care are important in promoting health and wellness. Ask your health care provider about: The right schedule for you to have regular tests and exams. Things you can do on your own to prevent diseases and keep yourself healthy. What should I know about diet, weight, and exercise? Eat a healthy diet  Eat a diet that includes plenty of vegetables, fruits, low-fat dairy products, and lean protein. Do not eat a lot of foods that are high in solid fats, added sugars, or sodium. Maintain a healthy weight Body mass index (BMI) is a measurement that can be used to identify possible weight problems. It estimates body fat based on height and weight. Your health care provider can help determine your BMI and help you achieve or maintain a healthy weight. Get regular exercise Get regular exercise. This is one of the most important things you can do for your health. Most adults should: Exercise for at least 150 minutes each week. The exercise should increase your heart rate and make you sweat (moderate-intensity exercise). Do strengthening exercises at least twice a week. This is in addition to the moderate-intensity exercise. Spend less time sitting. Even light physical activity can be beneficial. Watch cholesterol and blood lipids Have your blood tested for lipids and cholesterol at 71 years of age, then have this test every 5 years. You may need to have your cholesterol levels checked more often if: Your lipid or cholesterol levels are high. You are older than 71 years of age. You are at high risk for heart disease. What should I know about cancer screening? Many types of cancers can be detected early and may often be prevented. Depending on your health history and family history, you may need to have cancer screening at various ages. This may include screening for: Colorectal cancer. Prostate cancer. Skin cancer. Lung  cancer. What should I know about heart disease, diabetes, and high blood pressure? Blood pressure and heart disease High blood pressure causes heart disease and increases the risk of stroke. This is more likely to develop in people who have high blood pressure readings or are overweight. Talk with your health care provider about your target blood pressure readings. Have your blood pressure checked: Every 3-5 years if you are 18-39 years of age. Every year if you are 40 years old or older. If you are between the ages of 65 and 75 and are a current or former smoker, ask your health care provider if you should have a one-time screening for abdominal aortic aneurysm (AAA). Diabetes Have regular diabetes screenings. This checks your fasting blood sugar level. Have the screening done: Once every three years after age 45 if you are at a normal weight and have a low risk for diabetes. More often and at a younger age if you are overweight or have a high risk for diabetes. What should I know about preventing infection? Hepatitis B If you have a higher risk for hepatitis B, you should be screened for this virus. Talk with your health care provider to find out if you are at risk for hepatitis B infection. Hepatitis C Blood testing is recommended for: Everyone born from 1945 through 1965. Anyone with known risk factors for hepatitis C. Sexually transmitted infections (STIs) You should be screened each year for STIs, including gonorrhea and chlamydia, if: You are sexually active and are younger than 71 years of age. You are older than 71 years of age and your   health care provider tells you that you are at risk for this type of infection. Your sexual activity has changed since you were last screened, and you are at increased risk for chlamydia or gonorrhea. Ask your health care provider if you are at risk. Ask your health care provider about whether you are at high risk for HIV. Your health care provider  may recommend a prescription medicine to help prevent HIV infection. If you choose to take medicine to prevent HIV, you should first get tested for HIV. You should then be tested every 3 months for as long as you are taking the medicine. Follow these instructions at home: Alcohol use Do not drink alcohol if your health care provider tells you not to drink. If you drink alcohol: Limit how much you have to 0-2 drinks a day. Know how much alcohol is in your drink. In the [place] one drink equals one 12 oz bottle of beer (355 mL), one 5 oz glass of wine (148 mL), or one 1 oz glass of hard liquor (44 mL). Lifestyle Do not use any products that contain nicotine or tobacco. These products include cigarettes, chewing tobacco, and vaping devices, such as e-cigarettes. If you need help quitting, ask your health care provider. Do not use street drugs. Do not share needles. Ask your health care provider for help if you need support or information about quitting drugs. General instructions Schedule regular health, dental, and eye exams. Stay current with your vaccines. Tell your health care provider if: You often feel depressed. You have ever been abused or do not feel safe at home. Summary Adopting a healthy lifestyle and getting preventive care are important in promoting health and wellness. Follow your health care provider's instructions about healthy diet, exercising, and getting tested or screened for diseases. Follow your health care provider's instructions on monitoring your cholesterol and blood pressure. This information is not intended to replace advice given to you by your health care provider. Make sure you discuss any questions you have with your health care provider. Document Revised: 10/24/2020 Document Reviewed: 10/24/2020 Elsevier Patient Education  2024 Elsevier Inc.  
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
## 2023-05-23 LAB — COMPREHENSIVE METABOLIC PANEL
ALT: 8 [IU]/L (ref 0–44)
AST: 15 [IU]/L (ref 0–40)
Albumin: 4.6 g/dL (ref 3.8–4.8)
Alkaline Phosphatase: 58 [IU]/L (ref 44–121)
BUN/Creatinine Ratio: 19 (ref 10–24)
BUN: 22 mg/dL (ref 8–27)
Bilirubin Total: 0.9 mg/dL (ref 0.0–1.2)
CO2: 22 mmol/L (ref 20–29)
Calcium: 9.5 mg/dL (ref 8.6–10.2)
Chloride: 101 mmol/L (ref 96–106)
Creatinine, Ser: 1.17 mg/dL (ref 0.76–1.27)
Globulin, Total: 2.2 g/dL (ref 1.5–4.5)
Glucose: 87 mg/dL (ref 70–99)
Potassium: 4.6 mmol/L (ref 3.5–5.2)
Sodium: 141 mmol/L (ref 134–144)
Total Protein: 6.8 g/dL (ref 6.0–8.5)
eGFR: 67 mL/min/{1.73_m2} (ref >59)

## 2023-05-23 LAB — CBC
Hematocrit: 43.2 % (ref 37.5–51.0)
Hemoglobin: 14.4 g/dL (ref 13.0–17.7)
MCH: 31.4 pg (ref 26.6–33.0)
MCHC: 33.3 g/dL (ref 31.5–35.7)
MCV: 94 fL (ref 79–97)
Platelets: 246 10*3/uL (ref 150–450)
RBC: 4.58 x10E6/uL (ref 4.14–5.80)
RDW: 12.5 % (ref 11.6–15.4)
WBC: 7.6 10*3/uL (ref 3.4–10.8)

## 2023-05-23 LAB — TSH: TSH: 2.59 u[IU]/mL (ref 0.450–4.500)

## 2023-05-23 LAB — LIPID PANEL
Chol/HDL Ratio: 3.5 {ratio} (ref 0.0–5.0)
Cholesterol, Total: 189 mg/dL (ref 100–199)
HDL: 54 mg/dL (ref >39)
LDL Chol Calc (NIH): 120 mg/dL — ABNORMAL HIGH (ref 0–99)
Triglycerides: 83 mg/dL (ref 0–149)
VLDL Cholesterol Cal: 15 mg/dL (ref 5–40)

## 2023-05-23 LAB — PSA TOTAL (REFLEX TO FREE): Prostate Specific Ag, Serum: 3 ng/mL (ref 0.0–4.0)

## 2023-05-23 LAB — VITAMIN B12: Vitamin B-12: 304 pg/mL (ref 232–1245)

## 2023-05-24 ENCOUNTER — Telehealth: Payer: Self-pay

## 2023-05-24 NOTE — Telephone Encounter (Signed)
Pt requesting call back returning your call

## 2023-05-28 ENCOUNTER — Telehealth: Payer: Self-pay | Admitting: *Deleted

## 2023-05-28 ENCOUNTER — Other Ambulatory Visit: Payer: Self-pay | Admitting: *Deleted

## 2023-05-28 ENCOUNTER — Telehealth: Payer: Self-pay

## 2023-05-28 DIAGNOSIS — Z860100 Personal history of colon polyps, unspecified: Secondary | ICD-10-CM

## 2023-05-28 MED ORDER — PEG 3350-KCL-NABCB-NACL-NASULF 236 G PO SOLR: 4000.0000 mL | Freq: Once | ORAL | 0 refills | Status: AC

## 2023-05-28 NOTE — Telephone Encounter (Signed)
Per pt will send copy of new insurance card. Pt will have [employer] as of JAN.1st, 2025

## 2023-05-28 NOTE — Telephone Encounter (Signed)
Gastroenterology Pre-Procedure Review  Request Date: 07/03/2023 Requesting Physician: Dr. Servando Snare  PATIENT REVIEW QUESTIONS: The patient responded to the following health history questions as indicated:    1. Are you having any GI issues? no 2. Do you have a personal history of Polyps? yes (With Dr Markham Jordan on 06/26/2018) 3. Do you have a family history of Colon Cancer or Polyps? no 4. Diabetes Mellitus? no 5. Joint replacements in the past 12 months?no 6. Major health problems in the past 3 months?no 7. Any artificial heart valves, MVP, or defibrillator?no    MEDICATIONS & ALLERGIES:    Patient reports the following regarding taking any anticoagulation/antiplatelet therapy:   Plavix, Coumadin, Eliquis, Xarelto, Lovenox, Pradaxa, Brilinta, or Effient? no Aspirin? no  Patient confirms/reports the following medications:  Current Outpatient Medications  Medication Sig Dispense Refill   doxycycline (PERIOSTAT) 20 MG tablet Take 1 tablet (20 mg total) by mouth every evening. With food and plenty of fluid (Patient not taking: Reported on 03/25/2023) 30 tablet 6   hydrochlorothiazide (HYDRODIURIL) 12.5 MG tablet TAKE 1 TABLET BY MOUTH DAILY 90 tablet 3   pregabalin (LYRICA) 50 MG capsule Take 1 capsule (50 mg total) by mouth 2 (two) times daily. 60 capsule 1   tadalafil (CIALIS) 20 MG tablet TAKE ONE TABLET BY MOUTH NOT MORE THAN ONCE DAILY AS NEEDED FOR ERECTILE DYSFUNCTION 90 tablet 3   valsartan (DIOVAN) 40 MG tablet TAKE 1 TABLET BY MOUTH DAILY 90 tablet 3   No current facility-administered medications for this visit.    Patient confirms/reports the following allergies:  No Known Allergies  No orders of the defined types were placed in this encounter.   AUTHORIZATION INFORMATION Primary Insurance: 1D#: Group #:  Secondary Insurance: 1D#: Group #:  SCHEDULE INFORMATION: Date: 07/03/2023 Time: Location:  [facility]

## 2023-06-21 ENCOUNTER — Telehealth: Payer: Self-pay

## 2023-06-21 NOTE — Telephone Encounter (Signed)
 Pt requesting call back to schedule colonoscospy

## 2023-06-21 NOTE — Telephone Encounter (Signed)
 Spoken to patient's wife and she will let patient know I will call him again on Monday

## 2023-06-24 NOTE — Telephone Encounter (Signed)
 Spoken to patient and inform him that anesthesiologist and anesthesia is out of network.  Inform him that he can check with his insurance again regarding this. Gave him the billing department phone number as well to see if they can be at any assistant for patient.

## 2023-06-24 NOTE — Telephone Encounter (Signed)
 The patient called back to speak with Johny Drilling.

## 2023-06-24 NOTE — Telephone Encounter (Signed)
 Maralyn Sago,   Do you understand this. I'm ok with him being referred to any GI who is in his network.

## 2023-06-24 NOTE — Telephone Encounter (Signed)
 Copied from CRM [id number]. Topic: Referral - Question >> Jun 24, 2023 12:44 PM Leonette SQUIBB wrote: Reason for CRM: pt called saying Dr. Jinny is not in net work although [facility] is.  The insurance is telling him part of the services are covered.  He wants to know if it can be changed to another GI provider.  CB@  [phone]

## 2023-06-24 NOTE — Telephone Encounter (Signed)
 Spoken to patient and he stated that billing department was not helpful. He have been on hold with [employer] and did not get anyone to help yet. He will let me know by the end of the week to cancel or not due to cost.

## 2023-06-25 ENCOUNTER — Telehealth: Payer: Self-pay

## 2023-06-25 NOTE — Telephone Encounter (Signed)
 Pt requesting call back to schedule colonoscopy.

## 2023-06-25 NOTE — Telephone Encounter (Signed)
 Spoken to patient and cancel the colonoscopy as requested.

## 2023-07-03 ENCOUNTER — Ambulatory Visit: Admit: 2023-07-03 | Payer: Medicare HMO | Admitting: Gastroenterology

## 2023-07-03 SURGERY — COLONOSCOPY WITH PROPOFOL
Anesthesia: General

## 2023-07-20 ENCOUNTER — Other Ambulatory Visit: Payer: Self-pay | Admitting: Family Medicine

## 2023-07-20 DIAGNOSIS — M792 Neuralgia and neuritis, unspecified: Secondary | ICD-10-CM

## 2023-08-08 DIAGNOSIS — K080 Exfoliation of teeth due to systemic causes: Secondary | ICD-10-CM | POA: Diagnosis not present

## 2023-08-21 ENCOUNTER — Ambulatory Visit: Payer: Medicare HMO | Admitting: Dermatology

## 2023-10-07 DIAGNOSIS — K219 Gastro-esophageal reflux disease without esophagitis: Secondary | ICD-10-CM | POA: Diagnosis not present

## 2023-10-07 DIAGNOSIS — Z860101 Personal history of adenomatous and serrated colon polyps: Secondary | ICD-10-CM | POA: Diagnosis not present

## 2023-10-22 ENCOUNTER — Encounter: Payer: Self-pay | Admitting: Dermatology

## 2023-10-22 ENCOUNTER — Ambulatory Visit: Payer: Medicare HMO | Admitting: Dermatology

## 2023-10-22 DIAGNOSIS — L821 Other seborrheic keratosis: Secondary | ICD-10-CM

## 2023-10-22 DIAGNOSIS — D1801 Hemangioma of skin and subcutaneous tissue: Secondary | ICD-10-CM

## 2023-10-22 DIAGNOSIS — L570 Actinic keratosis: Secondary | ICD-10-CM | POA: Diagnosis not present

## 2023-10-22 DIAGNOSIS — L578 Other skin changes due to chronic exposure to nonionizing radiation: Secondary | ICD-10-CM | POA: Diagnosis not present

## 2023-10-22 DIAGNOSIS — L814 Other melanin hyperpigmentation: Secondary | ICD-10-CM

## 2023-10-22 DIAGNOSIS — L820 Inflamed seborrheic keratosis: Secondary | ICD-10-CM | POA: Diagnosis not present

## 2023-10-22 DIAGNOSIS — B351 Tinea unguium: Secondary | ICD-10-CM

## 2023-10-22 DIAGNOSIS — Z1283 Encounter for screening for malignant neoplasm of skin: Secondary | ICD-10-CM | POA: Diagnosis not present

## 2023-10-22 DIAGNOSIS — L719 Rosacea, unspecified: Secondary | ICD-10-CM

## 2023-10-22 DIAGNOSIS — B353 Tinea pedis: Secondary | ICD-10-CM

## 2023-10-22 DIAGNOSIS — Z7189 Other specified counseling: Secondary | ICD-10-CM

## 2023-10-22 DIAGNOSIS — D229 Melanocytic nevi, unspecified: Secondary | ICD-10-CM

## 2023-10-22 DIAGNOSIS — W908XXA Exposure to other nonionizing radiation, initial encounter: Secondary | ICD-10-CM

## 2023-10-22 MED ORDER — METRONIDAZOLE 1 % EX GEL: CUTANEOUS | 11 refills | Status: AC

## 2023-10-22 MED ORDER — METRONIDAZOLE 1 % EX GEL
CUTANEOUS | 11 refills | Status: DC
Start: 2023-10-22 — End: 2023-10-22

## 2023-10-22 NOTE — Patient Instructions (Addendum)

## 2023-10-22 NOTE — Progress Notes (Signed)
 Follow-Up Visit   Subjective  Anthony Reynolds is a 73 y.o. male who presents for the following: Skin Cancer Screening and Full Body Skin Exam, patient with a hx of tinea pedis took Lamisil  tablet for 3 months last year with a good response, Rosacea on his face treated with Metrogel  years ago.   The patient presents for Total-Body Skin Exam (TBSE) for skin cancer screening and mole check. The patient has spots, moles and lesions to be evaluated, some may be new or changing and the patient may have concern these could be cancer.  The following portions of the chart were reviewed this encounter and updated as appropriate: medications, allergies, medical history  Review of Systems:  No other skin or systemic complaints except as noted in HPI or Assessment and Plan.  Objective  Well appearing patient in no apparent distress; mood and affect are within normal limits.  A full examination was performed including scalp, head, eyes, ears, nose, lips, neck, chest, axillae, abdomen, back, buttocks, bilateral upper extremities, bilateral lower extremities, hands, feet, fingers, toes, fingernails, and toenails. All findings within normal limits unless otherwise noted below.   Relevant physical exam findings are noted in the Assessment and Plan.  face x 8 (8) Erythematous thin papules/macules with gritty scale.  left thigh x 1 Stuck-on, waxy, tan-brown papules and plaques -- Discussed benign etiology and prognosis.   Assessment & Plan   SKIN CANCER SCREENING PERFORMED TODAY.  ACTINIC DAMAGE - Chronic condition, secondary to cumulative UV/sun exposure - diffuse scaly erythematous macules with underlying dyspigmentation - Recommend daily broad spectrum sunscreen SPF 30+ to sun-exposed areas, reapply every 2 hours as needed.  - Staying in the shade or wearing long sleeves, sun glasses (UVA+UVB protection) and wide brim hats (4-inch brim around the entire circumference of the hat) are also recommended  for sun protection.  - Call for new or changing lesions.  LENTIGINES, SEBORRHEIC KERATOSES, HEMANGIOMAS - Benign normal skin lesions - Benign-appearing - Call for any changes  MELANOCYTIC NEVI - Tan-brown and/or pink-flesh-colored symmetric macules and papules - Benign appearing on exam today - Observation - Call clinic for new or changing moles - Recommend daily use of broad spectrum spf 30+ sunscreen to sun-exposed areas.    Rosacea Face Rosacea with Ocular Rosacea Rosacea is a chronic progressive skin condition usually affecting the face of adults, causing redness and/or acne bumps. It is treatable but not curable. It sometimes affects the eyes (ocular rosacea) as well. It may respond to topical and/or systemic medication and can flare with stress, sun exposure, alcohol, exercise, topical steroids (including hydrocortisone/cortisone 10) and some foods.  Daily application of broad spectrum spf 30+ sunscreen to face is recommended to reduce flares. Restart Metrogel  apply to face at bedtime  Patient decline Doxycycline     Tinea pedis of both feet/Tinea Unguium  Bilateral feet Chronic and persistent condition with duration or expected duration over one year. Condition is symptomatic / bothersome to patient. Not to goal. Has Improved since Lamisil  treatment Discussed we could re start Lamisil  treatment for 1 more month, patient decline    AK (ACTINIC KERATOSIS) (8) face x 8 (8) ACTINIC DAMAGE - chronic, secondary to cumulative UV radiation exposure/sun exposure over time - diffuse scaly erythematous macules with underlying dyspigmentation - Recommend daily broad spectrum sunscreen SPF 30+ to sun-exposed areas, reapply every 2 hours as needed.  - Recommend staying in the shade or wearing long sleeves, sun glasses (UVA+UVB protection) and wide brim hats (4-inch brim  around the entire circumference of the hat). - Call for new or changing lesions.  Destruction of lesion - face x 8  (8) Complexity: simple   Destruction method: cryotherapy   Informed consent: discussed and consent obtained   Timeout:  patient name, date of birth, surgical site, and procedure verified Lesion destroyed using liquid nitrogen: Yes   Region frozen until ice ball extended beyond lesion: Yes   Outcome: patient tolerated procedure well with no complications   Post-procedure details: wound care instructions given   INFLAMED SEBORRHEIC KERATOSIS left thigh x 1 Symptomatic, irritating, patient would like treated.  Destruction of lesion - left thigh x 1 Complexity: simple   Destruction method: cryotherapy   Informed consent: discussed and consent obtained   Timeout:  patient name, date of birth, surgical site, and procedure verified Lesion destroyed using liquid nitrogen: Yes   Region frozen until ice ball extended beyond lesion: Yes   Outcome: patient tolerated procedure well with no complications   Post-procedure details: wound care instructions given     Return in about 1 year (around 10/21/2024) for TBSE, Rosacea .  IClara Crisp, CMA, am acting as scribe for Celine Collard, MD .   Documentation: I have reviewed the above documentation for accuracy and completeness, and I agree with the above.  Celine Collard, MD

## 2023-11-22 ENCOUNTER — Encounter: Payer: Self-pay | Admitting: Family Medicine

## 2023-11-22 ENCOUNTER — Ambulatory Visit: Payer: Self-pay | Admitting: Family Medicine

## 2023-11-22 VITALS — BP 109/81 | HR 58 | Ht 69.0 in | Wt 182.0 lb

## 2023-11-22 DIAGNOSIS — M25541 Pain in joints of right hand: Secondary | ICD-10-CM

## 2023-11-22 DIAGNOSIS — R29898 Other symptoms and signs involving the musculoskeletal system: Secondary | ICD-10-CM | POA: Diagnosis not present

## 2023-11-22 DIAGNOSIS — M25542 Pain in joints of left hand: Secondary | ICD-10-CM

## 2023-11-22 DIAGNOSIS — R972 Elevated prostate specific antigen [PSA]: Secondary | ICD-10-CM

## 2023-11-24 LAB — COMPREHENSIVE METABOLIC PANEL WITH GFR
ALT: 8 IU/L (ref 0–44)
AST: 16 IU/L (ref 0–40)
Albumin: 4.4 g/dL (ref 3.8–4.8)
Alkaline Phosphatase: 56 IU/L (ref 44–121)
BUN/Creatinine Ratio: 19 (ref 10–24)
BUN: 20 mg/dL (ref 8–27)
Bilirubin Total: 0.8 mg/dL (ref 0.0–1.2)
CO2: 22 mmol/L (ref 20–29)
Calcium: 9.2 mg/dL (ref 8.6–10.2)
Chloride: 101 mmol/L (ref 96–106)
Creatinine, Ser: 1.03 mg/dL (ref 0.76–1.27)
Globulin, Total: 2.3 g/dL (ref 1.5–4.5)
Glucose: 88 mg/dL (ref 70–99)
Potassium: 3.9 mmol/L (ref 3.5–5.2)
Sodium: 139 mmol/L (ref 134–144)
Total Protein: 6.7 g/dL (ref 6.0–8.5)
eGFR: 77 mL/min/{1.73_m2} (ref >59)

## 2023-11-24 LAB — ANA W/REFLEX IF POSITIVE

## 2023-11-24 LAB — RHEUMATOID FACTOR: Rheumatoid fact SerPl-aCnc: <10 [IU]/mL (ref <14.0)

## 2023-11-24 LAB — CK: Total CK: 71 U/L (ref 41–331)

## 2023-11-24 LAB — PSA TOTAL (REFLEX TO FREE): Prostate Specific Ag, Serum: 2.3 ng/mL (ref 0.0–4.0)

## 2023-11-25 ENCOUNTER — Ambulatory Visit: Payer: Self-pay

## 2023-11-25 ENCOUNTER — Ambulatory Visit: Payer: Self-pay | Admitting: Family Medicine

## 2023-11-25 DIAGNOSIS — Z09 Encounter for follow-up examination after completed treatment for conditions other than malignant neoplasm: Secondary | ICD-10-CM | POA: Diagnosis not present

## 2023-11-25 DIAGNOSIS — K640 First degree hemorrhoids: Secondary | ICD-10-CM | POA: Diagnosis not present

## 2023-11-25 DIAGNOSIS — D124 Benign neoplasm of descending colon: Secondary | ICD-10-CM | POA: Diagnosis not present

## 2023-11-25 DIAGNOSIS — Z860101 Personal history of adenomatous and serrated colon polyps: Secondary | ICD-10-CM | POA: Diagnosis not present

## 2023-11-25 DIAGNOSIS — K635 Polyp of colon: Secondary | ICD-10-CM | POA: Diagnosis not present

## 2023-11-25 DIAGNOSIS — K5730 Diverticulosis of large intestine without perforation or abscess without bleeding: Secondary | ICD-10-CM | POA: Diagnosis not present

## 2023-11-25 DIAGNOSIS — K2100 Gastro-esophageal reflux disease with esophagitis, without bleeding: Secondary | ICD-10-CM | POA: Diagnosis not present

## 2023-12-01 ENCOUNTER — Encounter: Payer: Self-pay | Admitting: Family Medicine

## 2023-12-02 NOTE — Telephone Encounter (Signed)
 See result note.

## 2023-12-10 NOTE — Progress Notes (Signed)
 Established patient visit   Patient: Anthony Reynolds   DOB: 25-Jul-1950   73 y.o. Male  MRN: [id number] Visit Date: 11/22/2023  Today's healthcare provider: Nancyann Perry, MD   Chief Complaint  Patient presents with   Follow-up   Hypertension    Bp's readings at home are on the low side. 110/76 this morning, 105/76 after taking medicine after lunch.   Extremity Weakness   Subjective    Discussed the use of AI scribe software for clinical note transcription with the patient, who gave verbal consent to proceed.  History of Present Illness   Anthony Reynolds is a 73 year old male who presents for follow up hypertension and also reporting leg weakness and left knee pain.  He has been experiencing progressive leg weakness, making it increasingly difficult to rise from a seated position. His legs ache when standing still, although he can walk without significant issues. Despite not participating in formal exercise, he remains active by walking and being on his feet daily.  He describes left knee pain that began approximately four weeks ago, localized at the top of the knee and exacerbated by certain movements. The pain is not associated with swelling and subsides with slight movement. Walking does not significantly worsen the pain, but standing still increases discomfort. No swelling in the left knee is noted.  He experiences swelling in his hands, similar to the swelling in his feet. His hands become numb or 'fall asleep' when holding objects for extended periods, with symptoms resolving upon releasing the object and shaking his hands. This occurs in both hands, sometimes more in one than the other.  He has been monitoring his blood pressure, noting low readings, sometimes in the nineties. He is currently taking valsartan  for blood pressure management. He has lost about twenty pounds, bringing his weight to 182 pounds. His blood pressure medication was adjusted about a year ago  due to low readings.  He is scheduled for a colonoscopy due to indigestion and weight loss, as it has been five years since his last procedure. He recently celebrated his fiftieth wedding anniversary and plans to travel to [place] in August.     [place] Readings from Last 5 Encounters:  11/22/23 182 lb (82.6 kg)  05/22/23 190 lb 9.6 oz (86.5 kg)  04/03/23 193 lb (87.5 kg)  03/25/23 193 lb 14.4 oz (88 kg)  05/16/22 197 lb (89.4 kg)     Medications: Outpatient Medications Prior to Visit  Medication Sig   hydrochlorothiazide  (HYDRODIURIL ) 12.5 MG tablet TAKE 1 TABLET BY MOUTH DAILY   metroNIDAZOLE  (METROGEL ) 1 % gel Apply to face at bedtime for rosacea   tadalafil  (CIALIS ) 20 MG tablet TAKE ONE TABLET BY MOUTH NOT MORE THAN ONCE DAILY AS NEEDED FOR ERECTILE DYSFUNCTION   valsartan  (DIOVAN ) 40 MG tablet TAKE 1 TABLET BY MOUTH DAILY   doxycycline  (PERIOSTAT ) 20 MG tablet Take 1 tablet (20 mg total) by mouth every evening. With food and plenty of fluid (Patient not taking: Reported on 11/22/2023)   pregabalin  (LYRICA ) 50 MG capsule TAKE 1 CAPSULE BY MOUTH 2 TIMES A DAY (Patient not taking: Reported on 11/22/2023)   No facility-administered medications prior to visit.   Review of Systems     Objective    BP 109/81 (BP Location: Left Arm, Patient Position: Sitting, Cuff Size: Normal)   Pulse (!) 58   Ht 5' 9 (1.753 m)   Wt 182 lb (82.6 kg)   SpO2  98%   BMI 26.88 kg/m   Physical Exam   General: Appearance:    Well developed, well nourished male in no acute distress  Eyes:    PERRL, conjunctiva/corneas clear, EOM's intact       Lungs:     Clear to auscultation bilaterally, respirations unlabored  Heart:    Bradycardic. Normal rhythm. No murmurs, rubs, or gallops.    MS:   All extremities are intact.    Neurologic:   Awake, alert, oriented x 3. No apparent focal neurological defect.          Assessment & Plan       Left knee pain Chronic pain for four weeks, worsens with standing,  relieved by movement. Differential includes osteoarthritis, tendinitis, or musculoskeletal issues. - Order blood tests for muscle enzymes and electrolytes.  Hand swelling and numbness Intermittent symptoms, resolve with movement. Possible carpal tunnel syndrome or peripheral neuropathy. - Order blood tests for inflammatory markers and neuropathy causes.  Hypertension Previously managed with valsartan . Recent lifestyle changes led to hypotension symptoms. Valsartan  discontinued to monitor blood pressure. - Discontinue valsartan . - Monitor blood pressure regularly. - Restart valsartan  if blood pressure increases.  General Health Maintenance  - Scheduled with [facility] for colonoscopy on Monday. - Order PSA test for prostate cancer screening - Encourage continued weight management.  Follow-up Plans discussed to monitor condition and treatment response. - Communicate lab results via MyChart or phone call. - Schedule follow-up appointment if necessary based on lab results.         Nancyann Perry, MD  [facility] [phone] (phone) [phone] (fax)  [facility]

## 2024-03-04 DIAGNOSIS — K080 Exfoliation of teeth due to systemic causes: Secondary | ICD-10-CM | POA: Diagnosis not present

## 2024-03-10 DIAGNOSIS — K080 Exfoliation of teeth due to systemic causes: Secondary | ICD-10-CM | POA: Diagnosis not present

## 2024-03-11 DIAGNOSIS — H43811 Vitreous degeneration, right eye: Secondary | ICD-10-CM | POA: Diagnosis not present

## 2024-03-11 DIAGNOSIS — H2513 Age-related nuclear cataract, bilateral: Secondary | ICD-10-CM | POA: Diagnosis not present

## 2024-03-11 DIAGNOSIS — H353131 Nonexudative age-related macular degeneration, bilateral, early dry stage: Secondary | ICD-10-CM | POA: Diagnosis not present

## 2024-04-08 ENCOUNTER — Ambulatory Visit: Payer: Medicare Other

## 2024-04-27 ENCOUNTER — Other Ambulatory Visit: Payer: Self-pay | Admitting: Family Medicine

## 2024-04-27 DIAGNOSIS — I10 Essential (primary) hypertension: Secondary | ICD-10-CM

## 2024-05-11 ENCOUNTER — Telehealth: Payer: Self-pay

## 2024-05-11 DIAGNOSIS — I10 Essential (primary) hypertension: Secondary | ICD-10-CM

## 2024-05-11 DIAGNOSIS — Z125 Encounter for screening for malignant neoplasm of prostate: Secondary | ICD-10-CM

## 2024-05-11 NOTE — Telephone Encounter (Unsigned)
 Copied from CRM [id number]. Topic: Clinical - Request for Lab/Test Order >> May 11, 2024  9:51 AM Hadassah PARAS wrote: Reason for CRM: Pt is requesting a blood work order to be scheduled the day of physical December 5th. Please advise #[phone].

## 2024-05-12 NOTE — Telephone Encounter (Signed)
 Orders placed.

## 2024-05-13 NOTE — Telephone Encounter (Signed)
 VM left OK to advise if call is returned

## 2024-05-22 ENCOUNTER — Encounter: Payer: Self-pay | Admitting: Family Medicine

## 2024-05-22 VITALS — BP 136/80 | HR 60 | Resp 16 | Ht 69.0 in | Wt 188.5 lb

## 2024-05-22 DIAGNOSIS — I10 Essential (primary) hypertension: Secondary | ICD-10-CM

## 2024-05-22 DIAGNOSIS — Z860100 Personal history of colon polyps, unspecified: Secondary | ICD-10-CM

## 2024-05-22 DIAGNOSIS — Z125 Encounter for screening for malignant neoplasm of prostate: Secondary | ICD-10-CM | POA: Diagnosis not present

## 2024-05-22 DIAGNOSIS — Z0001 Encounter for general adult medical examination with abnormal findings: Secondary | ICD-10-CM

## 2024-05-22 DIAGNOSIS — M7042 Prepatellar bursitis, left knee: Secondary | ICD-10-CM | POA: Diagnosis not present

## 2024-05-22 DIAGNOSIS — Z23 Encounter for immunization: Secondary | ICD-10-CM

## 2024-05-22 DIAGNOSIS — Z0000 Encounter for general adult medical examination without abnormal findings: Secondary | ICD-10-CM

## 2024-05-22 NOTE — Progress Notes (Unsigned)
 Chief Complaint  Patient presents with  . Medicare Wellness  . Annual Exam     Subjective:   Anthony Reynolds is a 73 y.o. male who presents for a Medicare Annual Wellness Visit.  Visit info / Clinical Intake: Medicare Wellness Visit Type:: Subsequent Annual Wellness Visit Persons participating in visit and providing information:: patient Medicare Wellness Visit Mode:: In-person (required for WTM) Interpreter Needed?: No Pre-visit prep was completed: no AWV questionnaire completed by patient prior to visit?: no Living arrangements:: lives with spouse/significant other Patient's Overall Health Status Rating: good Typical amount of pain: none Does pain affect daily life?: (!) yes (knee pain, left, worsening) Are you currently prescribed opioids?: no  Dietary Habits and Nutritional Risks How many meals a day?: 3 Eats fruit and vegetables daily?: yes Most meals are obtained by: preparing own meals In the last 2 weeks, have you had any of the following?: none Diabetic:: no  Functional Status Activities of Daily Living (to include ambulation/medication): Independent Ambulation: Independent Medication Administration: Independent Home Management (perform basic housework or laundry): Independent Manage your own finances?: yes Primary transportation is: driving Concerns about vision?: no *vision screening is required for WTM* Concerns about hearing?: no  Fall Screening Falls in the past year?: 0 Number of falls in past year: 0 Was there an injury with Fall?: 0 Fall Risk Category Calculator: 0 Patient Fall Risk Level: Low Fall Risk  Fall Risk Patient at Risk for Falls Due to: No Fall Risks Fall risk Follow up: Falls evaluation completed  Home and Transportation Safety: All rugs have non-skid backing?: N/A, no rugs All stairs or steps have railings?: yes Grab bars in the bathtub or shower?: yes Have non-skid surface in bathtub or shower?: yes Good home lighting?:  yes Regular seat belt use?: yes Hospital stays in the last year:: no  Cognitive Assessment Difficulty concentrating, remembering, or making decisions? : no Will 6CIT or Mini Cog be Completed: no 6CIT or Mini Cog Declined: patient alert, oriented, able to answer questions appropriately and recall recent events  Advance Directives (For Healthcare) Does Patient Have a Medical Advance Directive?: Yes Type of Advance Directive: Healthcare Power of [name]; Living will Copy of Healthcare Power of Attorney in Chart?: No - copy requested  Reviewed/Updated  Reviewed/Updated: Reviewed All (Medical, Surgical, Family, Medications, Allergies, Care Teams, Patient Goals)    Allergies (verified) Patient has no known allergies.   Current Medications (verified) Outpatient Encounter Medications as of 05/22/2024  Medication Sig  . hydrochlorothiazide  (HYDRODIURIL ) 12.5 MG tablet TAKE 1 TABLET BY MOUTH DAILY  . metroNIDAZOLE  (METROGEL ) 1 % gel Apply to face at bedtime for rosacea  . tadalafil  (CIALIS ) 20 MG tablet TAKE ONE TABLET BY MOUTH NOT MORE THAN ONCE DAILY AS NEEDED FOR ERECTILE DYSFUNCTION  . doxycycline  (PERIOSTAT ) 20 MG tablet Take 1 tablet (20 mg total) by mouth every evening. With food and plenty of fluid (Patient not taking: Reported on 11/22/2023)  . pregabalin  (LYRICA ) 50 MG capsule TAKE 1 CAPSULE BY MOUTH 2 TIMES A DAY (Patient not taking: Reported on 11/22/2023)   No facility-administered encounter medications on file as of 05/22/2024.    History: Past Medical History:  Diagnosis Date  . Hypertension   . Spermatocele    Past Surgical History:  Procedure Laterality Date  . COLONOSCOPY W/ POLYPECTOMY  06/24/13  . HERNIA REPAIR    . SKIN GRAFT SPLIT THICKNESS LEG / FOOT  01/2020   [facility]  . SPERMATOCELECTOMY Right 05/24/2017   Procedure: SPERMATOCELECTOMY;  Surgeon: Twylla Glendia BROCKS, MD;  Location: [facility] ORS;  Service: Urology;  Laterality: Right;  . TONSILLECTOMY     Family History   Problem Relation Age of Onset  . COPD Mother   . Heart attack Father   . Diabetes Father   . Aneurysm Brother   . Prostate cancer Neg Hx   . Kidney cancer Neg Hx   . Bladder Cancer Neg Hx    Social History   Occupational History  . Occupation: Self Employed    Comment: Farming  . Occupation: retired  Tobacco Use  . Smoking status: Former    Current packs/day: 0.00    Types: Cigarettes    Quit date: 06/18/1973    Years since quitting: 50.9  . Smokeless tobacco: Never  Vaping Use  . Vaping status: Never Used  Substance and Sexual Activity  . Alcohol use: No    Alcohol/week: 0.0 standard drinks of alcohol  . Drug use: No  . Sexual activity: Not on file   Tobacco Counseling Counseling given: Not Answered  SDOH Screenings   Food Insecurity: No Food Insecurity (05/22/2024)  Housing: Low Risk  (05/22/2024)  Transportation Needs: No Transportation Needs (05/22/2024)  Utilities: Not At Risk (05/22/2024)  Alcohol Screen: Low Risk  (04/03/2023)  Depression (PHQ2-9): Low Risk  (05/22/2024)  Financial Resource Strain: Low Risk  (04/03/2023)  Physical Activity: Insufficiently Active (04/03/2023)  Social Connections: Moderately Integrated (05/22/2024)  Stress: No Stress Concern Present (05/22/2024)  Tobacco Use: Medium Risk (05/22/2024)  Health Literacy: Adequate Health Literacy (05/22/2024)   See flowsheets for full screening details  Depression Screen PHQ 2 & 9 Depression Scale- Over the past 2 weeks, how often have you been bothered by any of the following problems? Little interest or pleasure in doing things: 0 Feeling down, depressed, or hopeless (PHQ Adolescent also includes...irritable): 0 PHQ-2 Total Score: 0     Goals Addressed            This Visit's Progress   . DIET - EAT MORE FRUITS AND VEGETABLES      . Exercise 150 min/wk Moderate Activity              Objective:    Today's Vitals   05/22/24 1053  Weight: 188 lb 8 oz (85.5 kg)  Height: 5' 9 (1.753 m)    Body mass index is 27.84 kg/m.  Hearing/Vision screen Vision Screening - Comments:: This year. Dr.Woodard Immunizations and Health Maintenance Health Maintenance  Topic Date Due  . Zoster Vaccines- Shingrix (1 of 2) 08/08/1970  . Influenza Vaccine  01/17/2024  . COVID-19 Vaccine (3 - 2025-26 season) 02/17/2024  . Medicare Annual Wellness (AWV)  05/22/2025  . DTaP/Tdap/Td (3 - Td or Tdap) 04/23/2028  . Colonoscopy  11/28/2028  . Pneumococcal Vaccine: 50+ Years  Completed  . Hepatitis C Screening  Completed  . Meningococcal B Vaccine  Aged Out        Assessment/Plan:  This is a routine wellness examination for Elsie.  Patient Care Team: Gasper Nancyann BRAVO, MD as PCP - General (Family Medicine) Mevelyn JONETTA Bathe, OD (Optometry) Twylla, Glendia BROCKS, MD (Urology)  I have personally reviewed and noted the following in the patient's chart:   Medical and social history Use of alcohol, tobacco or illicit drugs  Current medications and supplements including opioid prescriptions. Functional ability and status Nutritional status Physical activity Advanced directives List of other physicians Hospitalizations, surgeries, and ER visits in previous 12 months Vitals Screenings to include cognitive, depression,  and falls Referrals and appointments  Orders Placed This Encounter  Procedures  . Flu vaccine HIGH DOSE PF(Fluzone Trivalent)   In addition, I have reviewed and discussed with patient certain preventive protocols, quality metrics, and best practice recommendations. A written personalized care plan for preventive services as well as general preventive health recommendations were provided to patient.   Nancyann Perry, MD   05/22/2024   No follow-ups on file.  After Visit Summary: {CHL AMB AWV After Visit Summary:[phone]}  Nurse Notes: ***

## 2024-05-22 NOTE — Patient Instructions (Addendum)
 Please review the attached list of medications and notify my office if there are any errors.   Please bring all of your medications to every appointment so we can make sure that our medication list is the same as yours.   It looks like you are due for another shingles vaccine. If you can't find a record of getting the Shingrix vaccine at the your pharmacy.

## 2024-05-23 ENCOUNTER — Ambulatory Visit: Payer: Self-pay | Admitting: Family Medicine

## 2024-05-23 LAB — LIPID PANEL
Chol/HDL Ratio: 3.7 ratio (ref 0.0–5.0)
Cholesterol, Total: 184 mg/dL (ref 100–199)
HDL: 50 mg/dL (ref >39)
LDL Chol Calc (NIH): 124 mg/dL — ABNORMAL HIGH (ref 0–99)
Triglycerides: 54 mg/dL (ref 0–149)
VLDL Cholesterol Cal: 10 mg/dL (ref 5–40)

## 2024-05-23 LAB — COMPREHENSIVE METABOLIC PANEL WITH GFR
ALT: 9 IU/L (ref 0–44)
AST: 17 IU/L (ref 0–40)
Albumin: 4.5 g/dL (ref 3.8–4.8)
Alkaline Phosphatase: 65 IU/L (ref 47–123)
BUN/Creatinine Ratio: 27 — ABNORMAL HIGH (ref 10–24)
BUN: 31 mg/dL — ABNORMAL HIGH (ref 8–27)
Bilirubin Total: 0.8 mg/dL (ref 0.0–1.2)
CO2: 23 mmol/L (ref 20–29)
Calcium: 9.2 mg/dL (ref 8.6–10.2)
Chloride: 102 mmol/L (ref 96–106)
Creatinine, Ser: 1.13 mg/dL (ref 0.76–1.27)
Globulin, Total: 1.9 g/dL (ref 1.5–4.5)
Glucose: 98 mg/dL (ref 70–99)
Potassium: 4.4 mmol/L (ref 3.5–5.2)
Sodium: 139 mmol/L (ref 134–144)
Total Protein: 6.4 g/dL (ref 6.0–8.5)
eGFR: 69 mL/min/1.73 (ref >59)

## 2024-05-23 LAB — PSA TOTAL (REFLEX TO FREE): Prostate Specific Ag, Serum: 2.5 ng/mL (ref 0.0–4.0)

## 2024-05-23 LAB — CBC
Hematocrit: 42.2 % (ref 37.5–51.0)
Hemoglobin: 13.9 g/dL (ref 13.0–17.7)
MCH: 32.1 pg (ref 26.6–33.0)
MCHC: 32.9 g/dL (ref 31.5–35.7)
MCV: 98 fL — ABNORMAL HIGH (ref 79–97)
Platelets: 231 x10E3/uL (ref 150–450)
RBC: 4.33 x10E6/uL (ref 4.14–5.80)
RDW: 12.6 % (ref 11.6–15.4)
WBC: 7.9 x10E3/uL (ref 3.4–10.8)
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
## 2024-10-27 ENCOUNTER — Ambulatory Visit: Admitting: Dermatology

## 2025-05-24 ENCOUNTER — Encounter: Admitting: Family Medicine
